# Patient Record
Sex: Female | Born: 1995 | Hispanic: Yes | Smoking: Never smoker
Health system: Southern US, Community
[De-identification: ages and names within clinical notes are randomized; demographics above are authoritative.]

## PROBLEM LIST (undated history)

## (undated) DIAGNOSIS — J45909 Unspecified asthma, uncomplicated: Secondary | ICD-10-CM

## (undated) HISTORY — PX: NO PAST SURGERIES: SHX2092

## (undated) HISTORY — DX: Unspecified asthma, uncomplicated: J45.909

---
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
## 2013-06-17 ENCOUNTER — Ambulatory Visit: Payer: Self-pay | Admitting: Primary Care

## 2013-10-31 ENCOUNTER — Observation Stay: Payer: Self-pay

## 2013-11-01 ENCOUNTER — Inpatient Hospital Stay: Payer: Self-pay | Admitting: Obstetrics and Gynecology

## 2013-11-02 LAB — CBC WITH DIFFERENTIAL/PLATELET
BASOS ABS: 0.1 10*3/uL (ref 0.0–0.1)
Basophil %: 0.7 %
EOS PCT: 1.2 %
Eosinophil #: 0.1 10*3/uL (ref 0.0–0.7)
HCT: 33.8 % — AB (ref 35.0–47.0)
HGB: 11.1 g/dL — AB (ref 12.0–16.0)
Lymphocyte #: 2.1 10*3/uL (ref 1.0–3.6)
Lymphocyte %: 27.7 %
MCH: 27.5 pg (ref 26.0–34.0)
MCHC: 32.7 g/dL (ref 32.0–36.0)
MCV: 84 fL (ref 80–100)
Monocyte #: 0.7 x10 3/mm (ref 0.2–0.9)
Monocyte %: 8.7 %
NEUTROS PCT: 61.7 %
Neutrophil #: 4.7 10*3/uL (ref 1.4–6.5)
PLATELETS: 158 10*3/uL (ref 150–440)
RBC: 4.03 10*6/uL (ref 3.80–5.20)
RDW: 14.9 % — ABNORMAL HIGH (ref 11.5–14.5)
WBC: 7.7 10*3/uL (ref 3.6–11.0)

## 2013-11-03 LAB — HEMATOCRIT: HCT: 33.1 % — ABNORMAL LOW (ref 35.0–47.0)

## 2015-03-06 NOTE — H&P (Signed)
L&D Evaluation:  History:  HPI 19 yo G3P2002 with LMP of 01/17/13 & EDd of 10/24/13 & US dating at 19.5 weeks [facility] for post-dates IOL. EDD according to US is inaccurate due to a reading of 2011. Will call the clinic to see if pt had an US this pregnancy. Cervidil last pm and now is in active labor process. NO ROM, VB,BP stable, FHR reassuring.   Presents with IOL for post-dates   Patient's Medical History Asthma  Allergic rhinitis, anemia,   Patient's Surgical History none   Medications Pre Natal Vitamins   Allergies NKDA   Social History none   Family History Non-Contributory   ROS:  ROS All systems were reviewed.  HEENT, CNS, GI, GU, Respiratory, CV, Renal and Musculoskeletal systems were found to be normal.   Exam:  Vital Signs stable  107/61   General no apparent distress   Mental Status clear   Chest clear   Heart normal sinus rhythm, no murmur/gallop/rubs   Abdomen gravid, tender with contractions   Estimated Fetal Weight Average for gestational age   Fetal Position vtx   Back no CVAT   Reflexes 2+   Clonus positive, 1-2 beats Lt foot   Mebranes Ruptured, 0915 bloody clear   Description bloody   FHT normal rate with no decels, q 3 mins, mod   FHT Description reassuring, no decels, reactive   Ucx regular   Skin dry   Lymph no lymphadenopathy   Impression:  Impression active labor, IUP at 41 2/7/weeks   Plan:  Plan monitor contractions and for cervical change, AROM for bld tinged clear fluid   Comments Trying to get anatomy scan US since it is not on chart. AROM performed for blood tinged clear fluid. Cx 4/80/vtx-2   Electronic Signatures: Sharee PimpleJones, Kathlen Sakurai W (CNM)  (Signed 07-Jan-15 09:27)  Authored: L&D Evaluation   Last Updated: 07-Jan-15 09:27 by Sharee PimpleJones, Atif Chapple W (CNM)

## 2016-11-27 ENCOUNTER — Inpatient Hospital Stay (HOSPITAL_COMMUNITY): Admission: AD | Admit: 2016-11-27 | Payer: Self-pay | Source: Ambulatory Visit | Admitting: Family Medicine

## 2018-10-27 NOTE — L&D Delivery Note (Addendum)
Obstetrical Delivery Note   Date of Delivery:   09/29/2019 Primary OB:   [facility] Gestational Age/EDD: [redacted]w[redacted]d (Dated by 9 wk [place]) Antepartum complications: LLQ fetal cyst  Delivered By:   Dalia Heading, CNM  Delivery Type:   spontaneous vaginal delivery  Procedure Details:   Pushed with mother to reduce anterior lip. Mother than pushed just short of 2 hours in various positions to try to turn OP presentation. Baby delivered direct OP with nuchal cord x 1 reduced over shoulder. Terminal meconium present. Spontaneous delivery of intact placenta and 3 vessel cord. IV Pitocin and fundal massage resulted in firm fundus and minimal bleeding. EBL 100 ml Anesthesia:    epidural Intrapartum complications: Persistent occiput posterior GBS:    negative Laceration:    Small first degree perineal Episiotomy:    none Placenta:    Via active 3rd stage. To pathology: no Estimated Blood Loss:  100 ml Baby:    Liveborn female, Apgars 9/9, weight pending    Dalia Heading, CNM  Attestation of Attending Supervision of Advanced Practitioner (CNM):  Evaluation and management procedures were performed by the Advanced Practitioner under my supervision and collaboration.  Although I was not present for the delivery itself,  I have reviewed the Advanced Practitioner's note and chart, and I agree with the management and plan.  Barnett Applebaum, MD, [facility], [facility] 09/29/2019  7:15 AM

## 2019-03-01 ENCOUNTER — Other Ambulatory Visit (HOSPITAL_COMMUNITY)
Admission: RE | Admit: 2019-03-01 | Discharge: 2019-03-01 | Disposition: A | Discharge status: Home / Self Care | Payer: Medicaid Other | Source: Ambulatory Visit | Attending: Obstetrics and Gynecology | Admitting: Obstetrics and Gynecology

## 2019-03-01 ENCOUNTER — Encounter: Payer: Self-pay | Admitting: Obstetrics and Gynecology

## 2019-03-01 ENCOUNTER — Ambulatory Visit (INDEPENDENT_AMBULATORY_CARE_PROVIDER_SITE_OTHER): Payer: Medicaid Other | Admitting: Obstetrics and Gynecology

## 2019-03-01 ENCOUNTER — Other Ambulatory Visit: Payer: Self-pay

## 2019-03-01 ENCOUNTER — Ambulatory Visit (INDEPENDENT_AMBULATORY_CARE_PROVIDER_SITE_OTHER): Payer: Medicaid Other

## 2019-03-01 VITALS — BP 118/70 | Ht 66.0 in | Wt 186.0 lb

## 2019-03-01 DIAGNOSIS — Z113 Encounter for screening for infections with a predominantly sexual mode of transmission: Secondary | ICD-10-CM | POA: Insufficient documentation

## 2019-03-01 DIAGNOSIS — Z3A10 10 weeks gestation of pregnancy: Secondary | ICD-10-CM | POA: Diagnosis not present

## 2019-03-01 DIAGNOSIS — Z3481 Encounter for supervision of other normal pregnancy, first trimester: Secondary | ICD-10-CM | POA: Diagnosis not present

## 2019-03-01 DIAGNOSIS — Z3687 Encounter for antenatal screening for uncertain dates: Secondary | ICD-10-CM | POA: Diagnosis not present

## 2019-03-01 DIAGNOSIS — Z3480 Encounter for supervision of other normal pregnancy, unspecified trimester: Secondary | ICD-10-CM

## 2019-03-01 DIAGNOSIS — Z124 Encounter for screening for malignant neoplasm of cervix: Secondary | ICD-10-CM

## 2019-03-01 DIAGNOSIS — Z3400 Encounter for supervision of normal first pregnancy, unspecified trimester: Secondary | ICD-10-CM

## 2019-03-01 NOTE — Progress Notes (Signed)
03/01/2019   Chief Complaint: Missed period  Transfer of Care Patient: no  History of Present Illness: [name] is a 23 y.o. [id number] 6424w1d based on Patient's last menstrual period was 12/20/2018 (within days). with an Estimated Date of Delivery: 09/26/19, with the above CC.   Her periods were: regular periods every 30 days She was using no method when she conceived.  She has Positive signs or symptoms of nausea/vomiting of pregnancy. She has Negative signs or symptoms of miscarriage or preterm labor She was not taking different medications around the time she conceived/early pregnancy. Since her LMP, she has not used alcohol Since her LMP, she has not used tobacco products Since her LMP, she has not used illegal drugs.   She claims she has gained   0 pounds since the start of her pregnancy.   Infection History:  1. Since her LMP, she has not had a viral illness.  2. She admits to close contact with children on a regular basis  yes  3. She has a history of chicken pox, or vaccination for chicken pox in the past. 4. Patient or partner has history of genital herpes  no 5. History of STI (GC, CT, HPV, syphilis, HIV)  no   6.  She does not live with someone with TB or TB exposed. 7. History of recent travel :  no 8. She identifies Negative Zika risk factors for her and her partner 329. There are not cats in the home in the home  She understands that while pregnant she should not change cat litter.   Genetic Screening Questions: (Includes patient, baby's father, or anyone in either family)   1. [facility]/= [facility]  no 2. Thalassemia ([place], [place], Mediterranean, or Asian background): MCV<80  no 3. Neural tube defect (meningomyelocele, spina bifida, anencephaly)  no 4. Congenital heart defect  no  5. Down syndrome  no 6. Tay-Sachs (Jewish, [place])  no 7. Canavan's Disease  no 8. Sickle cell disease or trait (African)  no  9. Hemophilia or other blood disorders   no  10. Muscular dystrophy  no  11. Cystic fibrosis  no  12. Huntington's Chorea  no  13. Mental retardation/autism  no 14. Other inherited genetic or chromosomal disorder  no 15. Maternal metabolic disorder (DM, PKU, etc)  no 16. Patient or FOB with a child with a birth defect not listed above no  16a. Patient or FOB with a birth defect themselves no 17. Recurrent pregnancy loss, or stillbirth  no  18. Any medications since LMP other than prenatal vitamins (include vitamins, supplements, OTC meds, drugs, alcohol)  no 19. Any other genetic/environmental exposure to discuss  no  ROS:  ROS  OBGYN History: As per HPI. OB History  Gravida Para Term Preterm AB Living  4 3 3     3   SAB TAB Ectopic Multiple Live Births          3    # Outcome Date GA Lbr Len/2nd Weight Sex Delivery Anes PTL Lv  4 Current           3 Term 11/02/13 6611w0d  8 lb 2 oz (3.685 kg) F Vag-Spont   LIV  2 Term 01/16/11 3411w0d  9 lb 2 oz (4.139 kg) M Vag-Spont   LIV  1 Term 12/03/09 4114w0d  7 lb 2 oz (3.232 kg) F Vag-Spont   LIV    Any issues with any prior pregnancies: no Any prior children  are healthy, doing well, without any problems or issues: yes History of pap smears: No.  History of STIs: No   Past Medical History: Past Medical History:  Diagnosis Date  . Asthma     Past Surgical History: History reviewed. No pertinent surgical history.  Family History:  History reviewed. No pertinent family history. She denies any female cancers, bleeding or blood clotting disorders.  She denies any history of mental retardation, birth defects or genetic disorders in her or the FOB's history  Social History:  Social History   Socioeconomic History  . Marital status: Single    Spouse name: Not on file  . Number of children: Not on file  . Years of education: Not on file  . Highest education level: Not on file  Occupational History  . Not on file  Social Needs  . Financial resource strain: Not on file   . Food insecurity:    Worry: Not on file    Inability: Not on file  . Transportation needs:    Medical: Not on file    Non-medical: Not on file  Tobacco Use  . Smoking status: Never Smoker  . Smokeless tobacco: Never Used  Substance and Sexual Activity  . Alcohol use: Never    Frequency: Never  . Drug use: Never  . Sexual activity: Yes    Birth control/protection: None  Lifestyle  . Physical activity:    Days per week: Not on file    Minutes per session: Not on file  . Stress: Not on file  Relationships  . Social connections:    Talks on phone: Not on file    Gets together: Not on file    Attends religious service: Not on file    Active member of club or organization: Not on file    Attends meetings of clubs or organizations: Not on file    Relationship status: Not on file  . Intimate partner violence:    Fear of current or ex partner: Not on file    Emotionally abused: Not on file    Physically abused: Not on file    Forced sexual activity: Not on file  Other Topics Concern  . Not on file  Social History Narrative  . Not on file    Allergy: No Known Allergies  Current Outpatient Medications:  Current Outpatient Medications:  .  Prenatal Vit-Fe Fumarate-FA (MULTIVITAMIN-PRENATAL) 27-0.8 MG TABS tablet, Take 1 tablet by mouth daily at 12 noon., Disp: , Rfl:    Physical Exam: Physical Exam could not be performed. Because of the COVID-19 outbreak this visit was performed over the phone and not in person.    Assessment: [name] is a 23 y.o. [phone] [redacted]w[redacted]d based on Patient's last menstrual period was 12/20/2018 (within days). with an Estimated Date of Delivery: 09/26/19,  for prenatal care.  Plan:  1) Avoid alcoholic beverages. 2) Patient encouraged not to smoke.  3) Discontinue the use of all non-medicinal drugs and chemicals.  4) Take prenatal vitamins daily.  5) Seatbelt use advised 6) Nutrition, food safety (fish, cheese advisories, and high nitrite  foods) and exercise discussed. 7) Hospital and practice style delivering at [facility] discussed  8) Patient is asked about travel to areas at risk for the Zika virus, and counseled to avoid travel and exposure to mosquitoes or sexual partners who may have themselves been exposed to the virus. Testing is discussed, and will be ordered as appropriate.  9) Childbirth classes at [facility] advised 10)  Genetic Screening, such as with 1st Trimester Screening, cell free fetal DNA, AFP testing, and Ultrasound, as well as with amniocentesis and CVS as appropriate, is discussed with patient. She plans to have genetic testing this pregnancy.   NOB labs today Dating [place] today- Due date updated. Maternit21 desired- will need to obtain at next visit.  Problem list reviewed and updated.  I discussed the assessment and treatment plan with the patient. The patient was provided an opportunity to ask questions and all were answered. The patient agreed with the plan and demonstrated an understanding of the instructions.   Adelene Idler MD [facility], [facility] 03/01/2019 5:51 PM

## 2019-03-01 NOTE — Patient Instructions (Signed)
 Hello,  Given the current COVID-19 pandemic, our practice is making changes in how we are providing care to our patients. We are limiting in-person visits for the safety of all of our patients.   As a practice, we have met to discuss the best way to minimize visits, but still provide excellent care to our expecting mothers.  We have decided on the following visit structure for low-risk pregnancies.  Initial Pregnancy visit will be conducted as a telephone or web visit.  Between 10-14 weeks  there will be one in-person visit for an ultrasound, lab work, and genetic screening. 20 weeks in-person visit with an anatomy ultrasound  28 weeks in-person office visit for a 1-hour glucose test and a TDAP vaccination 32 weeks in-person office visit 34 weeks telephone visit 36 weeks in-person office visit for GBS, chlamydia, and gonorrhea testing 38 weeks in-person office visit 40 weeks in-person office visit  Understandably, some patients will require more visits than what is outlined above. Additional visits will be determined on a case-by-case basis.   We will, as always, be available for emergencies or to address concerns that might arise between in-person visits. We ask that you allow us the opportunity to address any concerns over the phone or through a virtual visit first. We will be available to return your phone calls throughout the day.   If you are able to purchase a scale, a blood pressure machine, and a home fetal doppler visits could be limited further. This will help decrease your exposure risks, but these purchases are not a necessity.   Things seem to change daily and there is the possibility that this structure could change, please be patient as we adapt to a new way of caring for patients.   Thank you for trusting us with your prenatal care. Our practice values you and looks forward to providing you with excellent care.   Sincerely,   [facility], [facility]      COVID-19 and Your Pregnancy FAQ  How can I prevent infection with COVID-19 during my pregnancy? Social distancing is key. Please limit any interactions in public. Try and work from home if possible. Frequently wash your hands after touching possibly contaminated surfaces. Avoid touching your face.  Minimize trips to the store. Consider online ordering when possible.   Should I wear a mask? YES. It is recommended by the CDC that all people wear a cloth mask or facial covering in public. You should wear a mask to your visits in the office. This will help reduce transmission as well as your risk or acquiring COVID-19. New studies are showing that even asymptomatic individuals can spread the virus from talking.   Where can I get a mask? [place] and the city of [place] are partnering to provide masks to community members. You can pick up a mask from several locations. This website also has instructions about how to make a mask by sewing or without sewing by using a t-shirt or bandana.  https://www.[place].gov/i-want-to/learn-about/covid-19-information-and-updates/covid-19-face-mask-project  Studies have shown that if you were a tube or nylon stocking from pantyhose over a cloth mask it makes the cloth mask almost as effective as a N95 mask.  https://www.npr.org/sections/goatsandsoda/2019/02/16/840146830/adding-a-nylon-stocking-layer-could-boost-protection-from-cloth-masks-study-find  What are the symptoms of COVID-19? Fever (greater than 100.4 F), dry cough, shortness of breath.  Am I more at risk for COVID-19 since I am pregnant? There is not currently data showing that pregnant women are more adversely impacted by COVID-19 than the general population. However,   we know that pregnant women tend to have worse respiratory complications from similar diseases such as the flu and SARS and for this reason should be considered an at-risk population.  What do I do if I am  experiencing the symptoms of COVID-19? Testing is being limited because of test availability. If you are experiencing symptoms you should quarantine yourself, and the members of your family, for at least 2 weeks at home.   Please visit this website for more information: https://www.cdc.gov/coronavirus/2019-ncov/if-you-are-sick/steps-when-sick.html  When should I go to the Emergency Room? Please go to the emergency room if you are experiencing ANY of these symptoms*:  1.    Difficulty breathing or shortness of breath 2.    Persistent pain or pressure in the chest 3.    Confusion or difficulty being aroused (or awakened) 4.    Bluish lips or face  *This list is not all inclusive. Please consult our office for any other symptoms that are severe or concerning.  What do I do if I am having difficulty breathing? You should go to the Emergency Room for evaluation. At this time they have a tent set up for evaluating patients with COVID-19 symptoms.   How will my prenatal care be different because of the COVID-19 pandemic? It has been recommended to reduce the frequency of face-to-face visits and use resources such as telephone and virtual visits when possible. Using a scale, blood pressure machine and fetal doppler at home can further help reduce face-to-face visits. You will be provided with additional information on this topic.  We ask that you come to your visits alone to minimize potential exposures to  COVID-19.  How can I receive childbirth education? At this time in-person classes have been cancelled. You can register for online childbirth education, breastfeeding, and newborn care classes.  Please visit:  [email or web address] for more information  How will my hospital birth experience be different? The hospital is currently limiting visitors. This means that while you are in labor you can only have one person at the hospital with you. Additional family members will not be allowed  to wait in the building or outside your room. Your one support person can be the father of the baby, a relative, a doula, or a friend. Once one support person is designated that person will wear a band. This band cannot be shared with multiple people.  Nitrous Gas is not being offered for pain relief since the tubing and filter for the machine can not be sanitized in a way to guarantee prevention of transmission of COVID-19.  Nasal cannula use of oxygen for fetal indications has also been discontinued.  Currently a clear plastic sheet is being hung between mom and the delivering provider during pushing and delivery to help prevent transmission of COVID-19.      How long will I stay in the hospital for after giving birth? It is also recommended that discharge home be expedited during the COVID-19 outbreak. This means staying for 1 day after a vaginal delivery and 2 days after a cesarean section. Patients who need to stay longer for medical reasons are allowed to do so, but the goal will be for expedited discharge home.   What if I have COVID-19 and I am in labor? We ask that you wear a mask while on labor and delivery. We will try and accommodate you being placed in a room that is capable of filtering the air. Please call ahead if you are in labor and on your   way to the hospital. The phone number for labor and delivery at  [facility] is [phone].  If I have COVID-19 when my baby is born how can I prevent my baby from contracting COVID-19? This is an issue that will have to be discussed on a case-by-case basis. Current recommendations suggest providing separate isolation rooms for both the mother and new infant as well as limiting visitors. However, there are practical challenges to this recommendation. The situation will assuredly change and decisions will be influenced by the desires of the mother and availability of space.  Some suggestions are the use of a curtain or  physical barrier between mom and infant, hand hygiene, mom wearing a mask, or 6 feet of spacing between a mom and infant.   Can I breastfeed during the COVID-19 pandemic?   Yes, breastfeeding is encouraged.  Can I breastfeed if I have COVID-19? Yes. Covid-19 has not been found in breast milk. This means you cannot give COVID-19 to your child through breast milk. Breast feeding will also help pass antibodies to fight infection to your baby.   What precautions should I take when breastfeeding if I have COVID-19? If a mother and newborn do room-in and the mother wishes to feed at the breast, she should put on a facemask and practice hand hygiene before each feeding.  What precautions should I take when pumping if I have COVID-19? Prior to expressing breast milk, mothers should practice hand hygiene. After each pumping session, all parts that come into contact with breast milk should be thoroughly washed and the entire pump should be appropriately disinfected per the manufacturer's instructions. This expressed breast milk should be fed to the newborn by a healthy caregiver.  What if I am pregnant and work in healthcare? Based on limited data regarding COVID-19 and pregnancy, ACOG currently does not propose creating additional restrictions on pregnant health care personnel because of COVID-19 alone. Pregnant women do not appear to be at higher risk of severe disease related to COVID-19. Pregnant health care personnel should follow CDC risk assessment and infection control guidelines for health care personnel exposed to patients with suspected or confirmed COVID-19. Adherence to recommended infection prevention and control practices is an important part of protecting all health care personnel in health care settings.    Information on COVID-19 in pregnancy is very limited; however, facilities may want to consider limiting exposure of pregnant health care personnel to patients with confirmed or suspected  COVID-19 infection, especially during higher-risk procedures (eg, aerosol-generating procedures), if feasible, based on staffing availability.    

## 2019-03-01 NOTE — Progress Notes (Signed)
NOB C/o nausea and vomiting, some stomach pain off and on Did not get flu shot this season  Only home pregnancy test

## 2019-03-02 LAB — MONITOR DRUG PROFILE 10(MW)
Amphetamine Scrn, Ur: NEGATIVE ng/mL
BARBITURATE SCREEN URINE: NEGATIVE ng/mL
BENZODIAZEPINE SCREEN, URINE: NEGATIVE ng/mL
CANNABINOIDS UR QL SCN: NEGATIVE ng/mL
Cocaine (Metab) Scrn, Ur: NEGATIVE ng/mL
Creatinine(Crt), U: 223.9 mg/dL (ref 20.0–300.0)
Methadone Screen, Urine: NEGATIVE ng/mL
OXYCODONE+OXYMORPHONE UR QL SCN: NEGATIVE ng/mL
Opiate Scrn, Ur: NEGATIVE ng/mL
Ph of Urine: 7 (ref 4.5–8.9)
Phencyclidine Qn, Ur: NEGATIVE ng/mL
Propoxyphene Scrn, Ur: NEGATIVE ng/mL

## 2019-03-02 LAB — RPR+RH+ABO+RUB AB+AB SCR+CB...
Antibody Screen: NEGATIVE
HIV Screen 4th Generation wRfx: NONREACTIVE
Hematocrit: 43.2 % (ref 34.0–46.6)
Hemoglobin: 14.4 g/dL (ref 11.1–15.9)
Hepatitis B Surface Ag: NEGATIVE
MCH: 27.6 pg (ref 26.6–33.0)
MCHC: 33.3 g/dL (ref 31.5–35.7)
MCV: 83 fL (ref 79–97)
Platelets: 209 10*3/uL (ref 150–450)
RBC: 5.22 x10E6/uL (ref 3.77–5.28)
RDW: 14.2 % (ref 11.7–15.4)
RPR Ser Ql: NONREACTIVE
Rh Factor: POSITIVE
Rubella Antibodies, IGG: 2.32 index (ref >0.99)
Varicella zoster IgG: 917 index (ref >165)
WBC: 9.9 10*3/uL (ref 3.4–10.8)

## 2019-03-03 LAB — CERVICOVAGINAL ANCILLARY ONLY
Chlamydia: NEGATIVE
Neisseria Gonorrhea: NEGATIVE

## 2019-03-03 LAB — URINE CULTURE: Organism ID, Bacteria: NO GROWTH

## 2019-03-04 LAB — CYTOLOGY - PAP: Diagnosis: NEGATIVE

## 2019-03-04 NOTE — Progress Notes (Signed)
Please call and let patient know results are normal. Thank you

## 2019-03-07 NOTE — Progress Notes (Signed)
Pt aware.

## 2019-03-22 ENCOUNTER — Other Ambulatory Visit: Payer: Self-pay

## 2019-03-22 ENCOUNTER — Encounter: Payer: Self-pay | Admitting: Obstetrics and Gynecology

## 2019-03-22 ENCOUNTER — Ambulatory Visit (INDEPENDENT_AMBULATORY_CARE_PROVIDER_SITE_OTHER): Payer: Medicaid Other | Admitting: Obstetrics and Gynecology

## 2019-03-22 VITALS — BP 122/80 | Wt 188.0 lb

## 2019-03-22 DIAGNOSIS — Z3480 Encounter for supervision of other normal pregnancy, unspecified trimester: Secondary | ICD-10-CM

## 2019-03-22 DIAGNOSIS — O99511 Diseases of the respiratory system complicating pregnancy, first trimester: Secondary | ICD-10-CM

## 2019-03-22 DIAGNOSIS — Z1379 Encounter for other screening for genetic and chromosomal anomalies: Secondary | ICD-10-CM

## 2019-03-22 DIAGNOSIS — Z3A12 12 weeks gestation of pregnancy: Secondary | ICD-10-CM

## 2019-03-22 DIAGNOSIS — J45909 Unspecified asthma, uncomplicated: Secondary | ICD-10-CM

## 2019-03-22 DIAGNOSIS — O99519 Diseases of the respiratory system complicating pregnancy, unspecified trimester: Secondary | ICD-10-CM | POA: Insufficient documentation

## 2019-03-22 NOTE — Progress Notes (Signed)
Routine Prenatal Care Visit  Subjective  Carmen Morales is a 23 y.o. [phone] at [redacted]w[redacted]d being seen today for ongoing prenatal care.  Carmen Morales is currently monitored for the following issues for this low-risk pregnancy and has Supervision of other normal pregnancy, antepartum and Asthma during pregnancy on their problem list.  ----------------------------------------------------------------------------------- Patient reports no complaints.    . Vag. Bleeding: None.   . Denies leaking of fluid.  ----------------------------------------------------------------------------------- The following portions of the patient's history were reviewed and updated as appropriate: allergies, current medications, past family history, past medical history, past social history, past surgical history and problem list. Problem list updated.   Objective  Blood pressure 122/80, weight 188 lb (85.3 kg), last menstrual period 12/20/2018. Pregravid weight 186 lb (84.4 kg) Total Weight Gain 2 lb (0.907 kg) Urinalysis: Urine Protein    Urine Glucose    Fetal Status: Fetal Heart Rate (bpm): 150         General:  Alert, oriented and cooperative. Patient is in no acute distress.  Skin: Skin is warm and dry. No rash noted.   Cardiovascular: Normal heart rate noted  Respiratory: Normal respiratory effort, no problems with respiration noted  Abdomen: Soft, gravid, appropriate for gestational age. Pain/Pressure: Absent     Pelvic:  Cervical exam deferred        Extremities: Normal range of motion.     Mental Status: Normal mood and affect. Normal behavior. Normal judgment and thought content.   Assessment   23 y.o. H4F2761 at [redacted]w[redacted]d by  10/04/2019, by Ultrasound presenting for routine prenatal visit  Plan   Pregnancy #4 Problems (from 12/20/18 to present)    Problem Noted Resolved   Supervision of other normal pregnancy, antepartum 03/22/2019 by Conard Novak, MD No   Overview Addendum 03/22/2019 11:26 AM by Conard Novak, MD    Clinic [facility] Prenatal Labs  Dating 9 wk u/s Blood type: O/Positive/-- (05/05 1518)   Genetic Screen 1 Screen:    AFP:     Quad:     NIPS: Antibody:Negative (05/05 1518)  Anatomic [place]  Rubella: 2.32 (05/05 1518)  Varicella: immune  GTT Early:               Third trimester:  RPR: Non Reactive (05/05 1518)   Rhogam  HBsAg: Negative (05/05 1518)   TDaP vaccine                       Flu Shot: HIV: Non Reactive (05/05 1518)   Baby Food                                GBS:   Contraception  Pap:  CBB     CS/VBAC    Support Person            Asthma during pregnancy 03/22/2019 by Conard Novak, MD No   Overview Signed 03/22/2019 11:26 AM by Conard Novak, MD    Rarely uses medicine. Continue to monitor.          Preterm labor symptoms and general obstetric precautions including but not limited to vaginal bleeding, contractions, leaking of fluid and fetal movement were reviewed in detail with the patient. Please refer to After Visit Summary for other counseling recommendations.   Return in about 4 weeks (around 04/19/2019) for Routine Prenatal Appointment/telephone.  Thomasene Mohair, MD, [facility], [facility] 03/22/2019  11:26 AM

## 2019-03-27 LAB — MATERNIT 21 PLUS CORE, BLOOD
Fetal Fraction: 12
Result (T21): NEGATIVE
Trisomy 13 (Patau syndrome): NEGATIVE
Trisomy 18 (Edwards syndrome): NEGATIVE
Trisomy 21 (Down syndrome): NEGATIVE

## 2019-03-28 ENCOUNTER — Telehealth: Payer: Self-pay

## 2019-03-28 NOTE — Telephone Encounter (Signed)
Pt calling for results except gender.  Would like for gender to be put in envelop.  [phone]  Courtesy call to pt to let her know SDJ is not in office today and will be here tomorrow.  Adv envelop will be up front for her to p/u.

## 2019-03-29 NOTE — Telephone Encounter (Signed)
Would you mind calling patient and letting her know her results were normal from a genetic standpoint? Also, would you mind printing her report and highlighting the gender and placing it in an envelope for her?  Thank you!

## 2019-03-29 NOTE — Telephone Encounter (Signed)
Pt aware via vm. Envelope up front for her

## 2019-04-19 ENCOUNTER — Other Ambulatory Visit: Payer: Self-pay

## 2019-04-19 ENCOUNTER — Ambulatory Visit (INDEPENDENT_AMBULATORY_CARE_PROVIDER_SITE_OTHER): Payer: Medicaid Other | Admitting: Certified Nurse Midwife

## 2019-04-19 DIAGNOSIS — Z3A16 16 weeks gestation of pregnancy: Secondary | ICD-10-CM

## 2019-04-19 DIAGNOSIS — Z3482 Encounter for supervision of other normal pregnancy, second trimester: Secondary | ICD-10-CM | POA: Diagnosis not present

## 2019-04-19 DIAGNOSIS — Z363 Encounter for antenatal screening for malformations: Secondary | ICD-10-CM

## 2019-04-19 NOTE — Progress Notes (Signed)
Routine Prenatal Care Visit- Virtual Visit  Subjective   Virtual Visit via Telephone Note  I connected with@ on 04/19/19 at 11:10 AM EDT by telephone and verified that I am speaking with the correct person using two identifiers.   I discussed the limitations, risks, security and privacy concerns of performing an evaluation and management service by telephone and the availability of in person appointments. I also discussed with the patient that there may be a patient responsible charge related to this service. The patient expressed understanding and agreed to proceed.  The patient was at home I spoke with the patient from my  Work station [employer] names of people involved in this encounter were: Loran Sentersita Johnson , and patient , and myself .   Carmen Morales is a 23 y.o. [phone] at 79109w0d being seen today for ongoing prenatal care.  She is currently monitored for the following issues for this low-risk pregnancy and has Supervision of other normal pregnancy, antepartum and Asthma during pregnancy on their problem list.  ----------------------------------------------------------------------------------- Patient reports no complaints.   Feeling some flutters  ----------------------------------------------------------------------------------- The following portions of the patient's history were reviewed and updated as appropriate: allergies, current medications, past family history, past medical history, past social history, past surgical history and problem list. Problem list updated.   Objective  Last menstrual period 12/20/2018. Pregravid weight 186 lb (84.4 kg) Total Weight Gain 2 lb (0.907 kg)  Fetal Status: feeling flutters Discussed negative MaterniT 21, does not want to know gender yet.  Physical Exam could not be performed. Because of the COVID-19 outbreak this visit was performed over the phone and not in person.   Assessment   23 y.o. [id number] at 60109w0d by  10/04/2019, by Ultrasound  presenting for routine prenatal visit  Plan   Pregnancy #4 Problems (from 12/20/18 to present)    Problem Noted Resolved   Supervision of other normal pregnancy, antepartum 03/22/2019 by Conard NovakJackson, Stephen D, MD No   Overview Addendum 03/22/2019 11:26 AM by Conard NovakJackson, Stephen D, MD    [facility]inic [facility] Prenatal Labs  Dating 9 wk u/s Blood type: O/Positive/-- (05/05 1518)   Genetic Screen 1 Screen:    AFP:     Quad:     NIPS: diploid XX Antibody:Negative (05/05 1518)  Anatomic US  Rubella: 2.32 (05/05 1518)  Varicella: immune  GTT Early:               Third trimester:  RPR: Non Reactive (05/05 1518)   Rhogam  HBsAg: Negative (05/05 1518)   TDaP vaccine                       Flu Shot: HIV: Non Reactive (05/05 1518)   Baby Food                                GBS:   Contraception  Pap:  CBB     CS/VBAC    Support Person            Asthma during pregnancy 03/22/2019 by Conard NovakJackson, Stephen D, MD No   Overview Signed 03/22/2019 11:26 AM by Conard NovakJackson, Stephen D, MD    Rarely uses medicine. Continue to monitor.          Gestational age appropriate obstetric precautions including but not limited to vaginal bleeding, contractions, leaking of fluid and fetal movement were reviewed in detail with the patient.  Follow Up Instructions: Offered MSAFP testing-declines at this time Anatomy scan and ROB in 4 weeks   I discussed the assessment and treatment plan with the patient. The patient was provided an opportunity to ask questions and all were answered. The patient agreed with the plan and demonstrated an understanding of the instructions.    I provided 7 minutes of non-face-to-face time during this encounter.   Dalia Heading, [facility], [facility] 04/19/2019 11:35 AM

## 2019-04-19 NOTE — Progress Notes (Signed)
No recent wt taken or BP at home; no problems.rj

## 2019-05-17 ENCOUNTER — Other Ambulatory Visit: Payer: Self-pay

## 2019-05-17 ENCOUNTER — Ambulatory Visit (INDEPENDENT_AMBULATORY_CARE_PROVIDER_SITE_OTHER): Payer: Medicaid Other

## 2019-05-17 ENCOUNTER — Ambulatory Visit (INDEPENDENT_AMBULATORY_CARE_PROVIDER_SITE_OTHER): Payer: Medicaid Other | Admitting: Advanced Practice Midwife

## 2019-05-17 ENCOUNTER — Encounter: Payer: Self-pay | Admitting: Advanced Practice Midwife

## 2019-05-17 VITALS — BP 124/78 | Wt 184.0 lb

## 2019-05-17 DIAGNOSIS — Z3A20 20 weeks gestation of pregnancy: Secondary | ICD-10-CM

## 2019-05-17 DIAGNOSIS — Z3482 Encounter for supervision of other normal pregnancy, second trimester: Secondary | ICD-10-CM

## 2019-05-17 DIAGNOSIS — Z363 Encounter for antenatal screening for malformations: Secondary | ICD-10-CM

## 2019-05-17 NOTE — Progress Notes (Signed)
Anatomy scan today. No vb. No lof.  Pt been really nauseas, but states its starting to get a little better at this point

## 2019-05-17 NOTE — Progress Notes (Signed)
Routine Prenatal Care Visit  Subjective  Carmen Morales Maryjean Morn is a 23 y.o. [phone] at [redacted]w[redacted]d being seen today for ongoing prenatal care.  She is currently monitored for the following issues for this low-risk pregnancy and has Supervision of other normal pregnancy, antepartum and Asthma during pregnancy on their problem list.  ----------------------------------------------------------------------------------- Patient reports no complaints. Her nausea is decreasing and she admits an appetite.   . Vag. Bleeding: None.  Movement: Present. Denies leaking of fluid.  ----------------------------------------------------------------------------------- The following portions of the patient's history were reviewed and updated as appropriate: allergies, current medications, past family history, past medical history, past social history, past surgical history and problem list. Problem list updated.   Objective  Blood pressure 124/78, weight 184 lb (83.5 kg), last menstrual period 12/20/2018. Pregravid weight 186 lb (84.4 kg) Total Weight Gain -2 lb (-0.907 kg) Urinalysis: Urine Protein    Urine Glucose    Fetal Status: Fetal Heart Rate (bpm): 137 Fundal Height: 20 cm Movement: Present     Anatomy scan: complete, normal, female, placenta anterior  General:  Alert, oriented and cooperative. Patient is in no acute distress.  Skin: Skin is warm and dry. No rash noted.   Cardiovascular: Normal heart rate noted  Respiratory: Normal respiratory effort, no problems with respiration noted  Abdomen: Soft, gravid, appropriate for gestational age. Pain/Pressure: Absent     Pelvic:  Cervical exam deferred        Extremities: Normal range of motion.     Mental Status: Normal mood and affect. Normal behavior. Normal judgment and thought content.   Assessment   23 y.o. [id number] at [redacted]w[redacted]d by  10/04/2019, by Ultrasound presenting for routine prenatal visit  Plan   Pregnancy #4 Problems (from 12/20/18 to present)    Problem Noted Resolved   Supervision of other normal pregnancy, antepartum 03/22/2019 by Will Bonnet, MD No   Overview Addendum 03/22/2019 11:26 AM by Will Bonnet, MD    [facility] Prenatal Labs  Dating 9 wk u/s Blood type: O/Positive/-- (05/05 1518)   Genetic Screen 1 Screen:    AFP:     Quad:     NIPS: Antibody:Negative (05/05 1518)  Anatomic [place]  Rubella: 2.32 (05/05 1518)  Varicella: immune  GTT Early:               Third trimester:  RPR: Non Reactive (05/05 1518)   Rhogam  HBsAg: Negative (05/05 1518)   TDaP vaccine                       Flu Shot: HIV: Non Reactive (05/05 1518)   Baby Food                                GBS:   Contraception  Pap:  CBB     CS/VBAC    Support Person            Asthma during pregnancy 03/22/2019 by Will Bonnet, MD No   Overview Signed 03/22/2019 11:26 AM by Will Bonnet, MD    Rarely uses medicine. Continue to monitor.          Preterm labor symptoms and general obstetric precautions including but not limited to vaginal bleeding, contractions, leaking of fluid and fetal movement were reviewed in detail with the patient. Please refer to After Visit Summary for other counseling recommendations.   Return in about 4 weeks (around  06/14/2019) for rob.  Tresea MallJane Carmela Piechowski, CNM 05/17/2019 10:58 AM

## 2019-06-14 ENCOUNTER — Other Ambulatory Visit: Payer: Self-pay

## 2019-06-14 ENCOUNTER — Ambulatory Visit (INDEPENDENT_AMBULATORY_CARE_PROVIDER_SITE_OTHER): Payer: Medicaid Other | Admitting: Maternal Newborn

## 2019-06-14 VITALS — BP 100/70 | Wt 187.0 lb

## 2019-06-14 DIAGNOSIS — Z3480 Encounter for supervision of other normal pregnancy, unspecified trimester: Secondary | ICD-10-CM

## 2019-06-14 DIAGNOSIS — Z3A24 24 weeks gestation of pregnancy: Secondary | ICD-10-CM

## 2019-06-14 DIAGNOSIS — Z3482 Encounter for supervision of other normal pregnancy, second trimester: Secondary | ICD-10-CM

## 2019-06-14 NOTE — Progress Notes (Signed)
    Routine Prenatal Care Visit  Subjective  Sharae Zappulla Maryjean Morn is a 23 y.o. [phone] at [redacted]w[redacted]d being seen today for ongoing prenatal care.  She is currently monitored for the following issues for this low-risk pregnancy and has Supervision of other normal pregnancy, antepartum and Asthma during pregnancy on their problem list.  ----------------------------------------------------------------------------------- Patient reports concern about low weight gain. Her appetite is improving and she is not having any nausea/vomiting. Vag. Bleeding: None.  Movement: Present. No leaking of fluid.  ----------------------------------------------------------------------------------- The following portions of the patient's history were reviewed and updated as appropriate: allergies, current medications, past family history, past medical history, past social history, past surgical history and problem list. Problem list updated.   Objective  Blood pressure 100/70, weight 187 lb (84.8 kg), last menstrual period 12/20/2018. Pregravid weight 186 lb (84.4 kg) Total Weight Gain 1 lb (0.454 kg)  Fetal Status: Fetal Heart Rate (bpm): 136 Fundal Height: 25 cm Movement: Present     General:  Alert, oriented and cooperative. Patient is in no acute distress.  Skin: Skin is warm and dry. No rash noted.   Cardiovascular: Normal heart rate noted  Respiratory: Normal respiratory effort, no problems with respiration noted  Abdomen: Soft, gravid, appropriate for gestational age. Pain/Pressure: Absent     Pelvic:  Cervical exam deferred        Extremities: Normal range of motion.  Edema: None  Mental Status: Normal mood and affect. Normal behavior. Normal judgment and thought content.     Assessment   23 y.o. [id number] at [redacted]w[redacted]d, EDD 10/04/2019 by Ultrasound presenting for a routine prenatal visit.  Plan   Pregnancy #4 Problems (from 12/20/18 to present)    Problem Noted Resolved   Supervision of other normal pregnancy,  antepartum 03/22/2019 by Will Bonnet, MD No   Overview Addendum 03/22/2019 11:26 AM by Will Bonnet, MD    [facility] Prenatal Labs  Dating 9 wk u/s Blood type: O/Positive/-- (05/05 1518)   Genetic Screen 1 Screen:    AFP:     Quad:     NIPS: Antibody:Negative (05/05 1518)  Anatomic [place]  Rubella: 2.32 (05/05 1518)  Varicella: immune  GTT Early:               Third trimester:  RPR: Non Reactive (05/05 1518)   Rhogam  HBsAg: Negative (05/05 1518)   TDaP vaccine                       Flu Shot: HIV: Non Reactive (05/05 1518)   Baby Food                                GBS:   Contraception  Pap:  CBB     CS/VBAC    Support Person            Asthma during pregnancy 03/22/2019 by Will Bonnet, MD No   Overview Signed 03/22/2019 11:26 AM by Will Bonnet, MD    Rarely uses medicine. Continue to monitor.       Discussed eating high calorie nutritional foods. Fundal height appropriate for gestational age.  GTT/labs next visit.  Please refer to After Visit Summary for other counseling recommendations.   Return in about 4 weeks (around 07/12/2019) for ROB and GTT/labs.  Avel Sensor, CNM 06/14/2019  11:02 AM

## 2019-06-14 NOTE — Patient Instructions (Signed)
Third Trimester of Pregnancy The third trimester is from week 28 through week 40 (months 7 through 9). The third trimester is a time when the unborn baby (fetus) is growing rapidly. At the end of the ninth month, the fetus is about 20 inches in length and weighs 6-10 pounds. Body changes during your third trimester Your body will continue to go through many changes during pregnancy. The changes vary from woman to woman. During the third trimester:  Your weight will continue to increase. You can expect to gain 25-35 pounds (11-16 kg) by the end of the pregnancy.  You may begin to get stretch marks on your hips, abdomen, and breasts.  You may urinate more often because the fetus is moving lower into your pelvis and pressing on your bladder.  You may develop or continue to have heartburn. This is caused by increased hormones that slow down muscles in the digestive tract.  You may develop or continue to have constipation because increased hormones slow digestion and cause the muscles that push waste through your intestines to relax.  You may develop hemorrhoids. These are swollen veins (varicose veins) in the rectum that can itch or be painful.  You may develop swollen, bulging veins (varicose veins) in your legs.  You may have increased body aches in the pelvis, back, or thighs. This is due to weight gain and increased hormones that are relaxing your joints.  You may have changes in your hair. These can include thickening of your hair, rapid growth, and changes in texture. Some women also have hair loss during or after pregnancy, or hair that feels dry or thin. Your hair will most likely return to normal after your baby is born.  Your breasts will continue to grow and they will continue to become tender. A yellow fluid (colostrum) may leak from your breasts. This is the first milk you are producing for your baby.  Your belly button may stick out.  You may notice more swelling in your hands,  face, or ankles.  You may have increased tingling or numbness in your hands, arms, and legs. The skin on your belly may also feel numb.  You may feel short of breath because of your expanding uterus.  You may have more problems sleeping. This can be caused by the size of your belly, increased need to urinate, and an increase in your body's metabolism.  You may notice the fetus "dropping," or moving lower in your abdomen (lightening).  You may have increased vaginal discharge.  You may notice your joints feel loose and you may have pain around your pelvic bone. What to expect at prenatal visits You will have prenatal exams every 2 weeks until week 36. Then you will have weekly prenatal exams. During a routine prenatal visit:  You will be weighed to make sure you and the baby are growing normally.  Your blood pressure will be taken.  Your abdomen will be measured to track your baby's growth.  The fetal heartbeat will be listened to.  Any test results from the previous visit will be discussed.  You may have a cervical check near your due date to see if your cervix has softened or thinned (effaced).  You will be tested for Group B streptococcus. This happens between 35 and 37 weeks. Your health care provider may ask you:  What your birth plan is.  How you are feeling.  If you are feeling the baby move.  If you have had any abnormal   symptoms, such as leaking fluid, bleeding, severe headaches, or abdominal cramping.  If you are using any tobacco products, including cigarettes, chewing tobacco, and electronic cigarettes.  If you have any questions. Other tests or screenings that may be performed during your third trimester include:  Blood tests that check for low iron levels (anemia).  Fetal testing to check the health, activity level, and growth of the fetus. Testing is done if you have certain medical conditions or if there are problems during the pregnancy.  Nonstress test  (NST). This test checks the health of your baby to make sure there are no signs of problems, such as the baby not getting enough oxygen. During this test, a belt is placed around your belly. The baby is made to move, and its heart rate is monitored during movement. What is false labor? False labor is a condition in which you feel small, irregular tightenings of the muscles in the womb (contractions) that usually go away with rest, changing position, or drinking water. These are called Braxton Hicks contractions. Contractions may last for hours, days, or even weeks before true labor sets in. If contractions come at regular intervals, become more frequent, increase in intensity, or become painful, you should see your health care provider. What are the signs of labor?  Abdominal cramps.  Regular contractions that start at 10 minutes apart and become stronger and more frequent with time.  Contractions that start on the top of the uterus and spread down to the lower abdomen and back.  Increased pelvic pressure and dull back pain.  A watery or bloody mucus discharge that comes from the vagina.  Leaking of amniotic fluid. This is also known as your "water breaking." It could be a slow trickle or a gush. Let your health care provider know if it has a color or strange odor. If you have any of these signs, call your health care provider right away, even if it is before your due date. Follow these instructions at home: Medicines  Follow your health care provider's instructions regarding medicine use. Specific medicines may be either safe or unsafe to take during pregnancy.  Take a prenatal vitamin that contains at least 600 micrograms (mcg) of folic acid.  If you develop constipation, try taking a stool softener if your health care provider approves. Eating and drinking   Eat a balanced diet that includes fresh fruits and vegetables, whole grains, good sources of protein such as meat, eggs, or tofu,  and low-fat dairy. Your health care provider will help you determine the amount of weight gain that is right for you.  Avoid raw meat and uncooked cheese. These carry germs that can cause birth defects in the baby.  If you have low calcium intake from food, talk to your health care provider about whether you should take a daily calcium supplement.  Eat four or five small meals rather than three large meals a day.  Limit foods that are high in fat and processed sugars, such as fried and sweet foods.  To prevent constipation: ? Drink enough fluid to keep your urine clear or pale yellow. ? Eat foods that are high in fiber, such as fresh fruits and vegetables, whole grains, and beans. Activity  Exercise only as directed by your health care provider. Most women can continue their usual exercise routine during pregnancy. Try to exercise for 30 minutes at least 5 days a week. Stop exercising if you experience uterine contractions.  Avoid heavy lifting.  Do   not exercise in extreme heat or humidity, or at high altitudes.  Wear low-heel, comfortable shoes.  Practice good posture.  You may continue to have sex unless your health care provider tells you otherwise. Relieving pain and discomfort  Take frequent breaks and rest with your legs elevated if you have leg cramps or low back pain.  Take warm sitz baths to soothe any pain or discomfort caused by hemorrhoids. Use hemorrhoid cream if your health care provider approves.  Wear a good support bra to prevent discomfort from breast tenderness.  If you develop varicose veins: ? Wear support pantyhose or compression stockings as told by your healthcare provider. ? Elevate your feet for 15 minutes, 3-4 times a day. Prenatal care  Write down your questions. Take them to your prenatal visits.  Keep all your prenatal visits as told by your health care provider. This is important. Safety  Wear your seat belt at all times when driving.  Make  a list of emergency phone numbers, including numbers for family, friends, the hospital, and police and fire departments. General instructions  Avoid cat litter boxes and soil used by cats. These carry germs that can cause birth defects in the baby. If you have a cat, ask someone to clean the litter box for you.  Do not travel far distances unless it is absolutely necessary and only with the approval of your health care provider.  Do not use hot tubs, steam rooms, or saunas.  Do not drink alcohol.  Do not use any products that contain nicotine or tobacco, such as cigarettes and e-cigarettes. If you need help quitting, ask your health care provider.  Do not use any medicinal herbs or unprescribed drugs. These chemicals affect the formation and growth of the baby.  Do not douche or use tampons or scented sanitary pads.  Do not cross your legs for long periods of time.  To prepare for the arrival of your baby: ? Take prenatal classes to understand, practice, and ask questions about labor and delivery. ? Make a trial run to the hospital. ? Visit the hospital and tour the maternity area. ? Arrange for maternity or paternity leave through employers. ? Arrange for family and friends to take care of pets while you are in the hospital. ? Purchase a rear-facing car seat and make sure you know how to install it in your car. ? Pack your hospital bag. ? Prepare the baby's nursery. Make sure to remove all pillows and stuffed animals from the baby's crib to prevent suffocation.  Visit your dentist if you have not gone during your pregnancy. Use a soft toothbrush to brush your teeth and be gentle when you floss. Contact a health care provider if:  You are unsure if you are in labor or if your water has broken.  You become dizzy.  You have mild pelvic cramps, pelvic pressure, or nagging pain in your abdominal area.  You have lower back pain.  You have persistent nausea, vomiting, or diarrhea.   You have an unusual or bad smelling vaginal discharge.  You have pain when you urinate. Get help right away if:  Your water breaks before 37 weeks.  You have regular contractions less than 5 minutes apart before 37 weeks.  You have a fever.  You are leaking fluid from your vagina.  You have spotting or bleeding from your vagina.  You have severe abdominal pain or cramping.  You have rapid weight loss or weight gain.  You have   shortness of breath with chest pain.  You notice sudden or extreme swelling of your face, hands, ankles, feet, or legs.  Your baby makes fewer than 10 movements in 2 hours.  You have severe headaches that do not go away when you take medicine.  You have vision changes. Summary  The third trimester is from week 28 through week 40, months 7 through 9. The third trimester is a time when the unborn baby (fetus) is growing rapidly.  During the third trimester, your discomfort may increase as you and your baby continue to gain weight. You may have abdominal, leg, and back pain, sleeping problems, and an increased need to urinate.  During the third trimester your breasts will keep growing and they will continue to become tender. A yellow fluid (colostrum) may leak from your breasts. This is the first milk you are producing for your baby.  False labor is a condition in which you feel small, irregular tightenings of the muscles in the womb (contractions) that eventually go away. These are called Braxton Hicks contractions. Contractions may last for hours, days, or even weeks before true labor sets in.  Signs of labor can include: abdominal cramps; regular contractions that start at 10 minutes apart and become stronger and more frequent with time; watery or bloody mucus discharge that comes from the vagina; increased pelvic pressure and dull back pain; and leaking of amniotic fluid. This information is not intended to replace advice given to you by your health  care provider. Make sure you discuss any questions you have with your health care provider. Document Released: 10/07/2001 Document Revised: 02/03/2019 Document Reviewed: 11/18/2016 Elsevier Patient Education  2020 [employer]  

## 2019-07-14 ENCOUNTER — Encounter: Payer: Medicaid Other | Admitting: Obstetrics and Gynecology

## 2019-07-14 ENCOUNTER — Other Ambulatory Visit: Payer: Medicaid Other

## 2019-07-22 ENCOUNTER — Other Ambulatory Visit: Payer: Medicaid Other

## 2019-07-22 ENCOUNTER — Encounter: Payer: Self-pay | Admitting: Obstetrics & Gynecology

## 2019-07-22 ENCOUNTER — Other Ambulatory Visit: Payer: Self-pay

## 2019-07-22 ENCOUNTER — Ambulatory Visit (INDEPENDENT_AMBULATORY_CARE_PROVIDER_SITE_OTHER): Payer: Medicaid Other | Admitting: Obstetrics & Gynecology

## 2019-07-22 VITALS — BP 100/70 | Wt 193.0 lb

## 2019-07-22 DIAGNOSIS — Z3480 Encounter for supervision of other normal pregnancy, unspecified trimester: Secondary | ICD-10-CM

## 2019-07-22 DIAGNOSIS — Z3483 Encounter for supervision of other normal pregnancy, third trimester: Secondary | ICD-10-CM

## 2019-07-22 DIAGNOSIS — Z3A29 29 weeks gestation of pregnancy: Secondary | ICD-10-CM

## 2019-07-22 LAB — POCT URINALYSIS DIPSTICK OB
Glucose, UA: NEGATIVE
POC,PROTEIN,UA: NEGATIVE

## 2019-07-22 NOTE — Addendum Note (Signed)
Addended by: Quintella Baton D on: 07/22/2019 10:01 AM   Modules accepted: Orders

## 2019-07-22 NOTE — Progress Notes (Signed)
  Subjective  Fetal Movement? yes Contractions? no Leaking Fluid? no Vaginal Bleeding? no  Objective  BP 100/70   Wt 193 lb (87.5 kg)   LMP 12/20/2018 (Within Days)   BMI 31.15 kg/m  General: NAD Pumonary: no increased work of breathing Abdomen: gravid, non-tender Extremities: no edema Psychiatric: mood appropriate, affect full  Assessment  23 y.o. [id number] at [redacted]w[redacted]d by  10/04/2019, by Ultrasound presenting for routine prenatal visit  Plan   Problem List Items Addressed This Visit      Other   Supervision of other normal pregnancy, antepartum    Other Visit Diagnoses    [redacted] weeks gestation of pregnancy    -  Primary      Pregnancy #4 Problems (from 12/20/18 to present)    Problem Noted Resolved   Supervision of other normal pregnancy, antepartum 03/22/2019 by Will Bonnet, MD No   Overview Addendum 07/22/2019  9:54 AM by Gae Dry, MD    [facility] Prenatal Labs  Dating 9 wk u/s Blood type: O/Positive/-- (05/05 1518)   Genetic Screen      NIPS: diploid XX Antibody:Negative (05/05 1518)  Anatomic [place] Normal anatomy scan Rubella: 2.32 (05/05 1518)  Varicella: immune  GTT   Third trimester:  RPR: Non Reactive (05/05 1518)   Rhogam na HBsAg: Negative (05/05 1518)   TDaP vaccine               Flu Shot: HIV: Non Reactive (05/05 1518)   Baby Food                                GBS:   Contraception  Pap:02/2019  CBB  no   CS/VBAC n/a   Support Person            Asthma during pregnancy 03/22/2019 by Will Bonnet, MD No   Overview Signed 03/22/2019 11:26 AM by Will Bonnet, MD    Rarely uses medicine. Continue to monitor.        Glucola today  Barnett Applebaum, MD, [facility], [facility] 07/22/2019  9:59 AM

## 2019-07-23 LAB — 28 WEEK RH+PANEL
Basophils Absolute: 0 10*3/uL (ref 0.0–0.2)
Basos: 0 %
EOS (ABSOLUTE): 0.2 10*3/uL (ref 0.0–0.4)
Eos: 2 %
Gestational Diabetes Screen: 138 mg/dL (ref 65–139)
HIV Screen 4th Generation wRfx: NONREACTIVE
Hematocrit: 35.8 % (ref 34.0–46.6)
Hemoglobin: 11.5 g/dL (ref 11.1–15.9)
Immature Grans (Abs): 0.1 10*3/uL (ref 0.0–0.1)
Immature Granulocytes: 1 %
Lymphocytes Absolute: 1.8 10*3/uL (ref 0.7–3.1)
Lymphs: 25 %
MCH: 27.8 pg (ref 26.6–33.0)
MCHC: 32.1 g/dL (ref 31.5–35.7)
MCV: 87 fL (ref 79–97)
Monocytes Absolute: 0.4 10*3/uL (ref 0.1–0.9)
Monocytes: 6 %
Neutrophils Absolute: 4.6 10*3/uL (ref 1.4–7.0)
Neutrophils: 66 %
Platelets: 163 10*3/uL (ref 150–450)
RBC: 4.13 x10E6/uL (ref 3.77–5.28)
RDW: 14.2 % (ref 11.7–15.4)
RPR Ser Ql: NONREACTIVE
WBC: 7 10*3/uL (ref 3.4–10.8)

## 2019-08-05 ENCOUNTER — Ambulatory Visit (INDEPENDENT_AMBULATORY_CARE_PROVIDER_SITE_OTHER): Payer: Medicaid Other | Admitting: Maternal Newborn

## 2019-08-05 ENCOUNTER — Other Ambulatory Visit: Payer: Self-pay

## 2019-08-05 ENCOUNTER — Encounter: Payer: Self-pay | Admitting: Maternal Newborn

## 2019-08-05 VITALS — BP 110/60 | Wt 195.0 lb

## 2019-08-05 DIAGNOSIS — Z3483 Encounter for supervision of other normal pregnancy, third trimester: Secondary | ICD-10-CM

## 2019-08-05 DIAGNOSIS — Z3A31 31 weeks gestation of pregnancy: Secondary | ICD-10-CM

## 2019-08-05 DIAGNOSIS — Z3480 Encounter for supervision of other normal pregnancy, unspecified trimester: Secondary | ICD-10-CM

## 2019-08-05 NOTE — Progress Notes (Signed)
ROB- no concerns/declines flu shot and will think about TDap but is signing BT consent today

## 2019-08-05 NOTE — Patient Instructions (Signed)
Third Trimester of Pregnancy The third trimester is from week 28 through week 40 (months 7 through 9). The third trimester is a time when the unborn baby (fetus) is growing rapidly. At the end of the ninth month, the fetus is about 20 inches in length and weighs 6-10 pounds. Body changes during your third trimester Your body will continue to go through many changes during pregnancy. The changes vary from woman to woman. During the third trimester:  Your weight will continue to increase. You can expect to gain 25-35 pounds (11-16 kg) by the end of the pregnancy.  You may begin to get stretch marks on your hips, abdomen, and breasts.  You may urinate more often because the fetus is moving lower into your pelvis and pressing on your bladder.  You may develop or continue to have heartburn. This is caused by increased hormones that slow down muscles in the digestive tract.  You may develop or continue to have constipation because increased hormones slow digestion and cause the muscles that push waste through your intestines to relax.  You may develop hemorrhoids. These are swollen veins (varicose veins) in the rectum that can itch or be painful.  You may develop swollen, bulging veins (varicose veins) in your legs.  You may have increased body aches in the pelvis, back, or thighs. This is due to weight gain and increased hormones that are relaxing your joints.  You may have changes in your hair. These can include thickening of your hair, rapid growth, and changes in texture. Some women also have hair loss during or after pregnancy, or hair that feels dry or thin. Your hair will most likely return to normal after your baby is born.  Your breasts will continue to grow and they will continue to become tender. A yellow fluid (colostrum) may leak from your breasts. This is the first milk you are producing for your baby.  Your belly button may stick out.  You may notice more swelling in your hands,  face, or ankles.  You may have increased tingling or numbness in your hands, arms, and legs. The skin on your belly may also feel numb.  You may feel short of breath because of your expanding uterus.  You may have more problems sleeping. This can be caused by the size of your belly, increased need to urinate, and an increase in your body's metabolism.  You may notice the fetus "dropping," or moving lower in your abdomen (lightening).  You may have increased vaginal discharge.  You may notice your joints feel loose and you may have pain around your pelvic bone. What to expect at prenatal visits You will have prenatal exams every 2 weeks until week 36. Then you will have weekly prenatal exams. During a routine prenatal visit:  You will be weighed to make sure you and the baby are growing normally.  Your blood pressure will be taken.  Your abdomen will be measured to track your baby's growth.  The fetal heartbeat will be listened to.  Any test results from the previous visit will be discussed.  You may have a cervical check near your due date to see if your cervix has softened or thinned (effaced).  You will be tested for Group B streptococcus. This happens between 35 and 37 weeks. Your health care provider may ask you:  What your birth plan is.  How you are feeling.  If you are feeling the baby move.  If you have had any abnormal   symptoms, such as leaking fluid, bleeding, severe headaches, or abdominal cramping.  If you are using any tobacco products, including cigarettes, chewing tobacco, and electronic cigarettes.  If you have any questions. Other tests or screenings that may be performed during your third trimester include:  Blood tests that check for low iron levels (anemia).  Fetal testing to check the health, activity level, and growth of the fetus. Testing is done if you have certain medical conditions or if there are problems during the pregnancy.  Nonstress test  (NST). This test checks the health of your baby to make sure there are no signs of problems, such as the baby not getting enough oxygen. During this test, a belt is placed around your belly. The baby is made to move, and its heart rate is monitored during movement. What is false labor? False labor is a condition in which you feel small, irregular tightenings of the muscles in the womb (contractions) that usually go away with rest, changing position, or drinking water. These are called Braxton Hicks contractions. Contractions may last for hours, days, or even weeks before true labor sets in. If contractions come at regular intervals, become more frequent, increase in intensity, or become painful, you should see your health care provider. What are the signs of labor?  Abdominal cramps.  Regular contractions that start at 10 minutes apart and become stronger and more frequent with time.  Contractions that start on the top of the uterus and spread down to the lower abdomen and back.  Increased pelvic pressure and dull back pain.  A watery or bloody mucus discharge that comes from the vagina.  Leaking of amniotic fluid. This is also known as your "water breaking." It could be a slow trickle or a gush. Let your health care provider know if it has a color or strange odor. If you have any of these signs, call your health care provider right away, even if it is before your due date. Follow these instructions at home: Medicines  Follow your health care provider's instructions regarding medicine use. Specific medicines may be either safe or unsafe to take during pregnancy.  Take a prenatal vitamin that contains at least 600 micrograms (mcg) of folic acid.  If you develop constipation, try taking a stool softener if your health care provider approves. Eating and drinking   Eat a balanced diet that includes fresh fruits and vegetables, whole grains, good sources of protein such as meat, eggs, or tofu,  and low-fat dairy. Your health care provider will help you determine the amount of weight gain that is right for you.  Avoid raw meat and uncooked cheese. These carry germs that can cause birth defects in the baby.  If you have low calcium intake from food, talk to your health care provider about whether you should take a daily calcium supplement.  Eat four or five small meals rather than three large meals a day.  Limit foods that are high in fat and processed sugars, such as fried and sweet foods.  To prevent constipation: ? Drink enough fluid to keep your urine clear or pale yellow. ? Eat foods that are high in fiber, such as fresh fruits and vegetables, whole grains, and beans. Activity  Exercise only as directed by your health care provider. Most women can continue their usual exercise routine during pregnancy. Try to exercise for 30 minutes at least 5 days a week. Stop exercising if you experience uterine contractions.  Avoid heavy lifting.  Do   not exercise in extreme heat or humidity, or at high altitudes.  Wear low-heel, comfortable shoes.  Practice good posture.  You may continue to have sex unless your health care provider tells you otherwise. Relieving pain and discomfort  Take frequent breaks and rest with your legs elevated if you have leg cramps or low back pain.  Take warm sitz baths to soothe any pain or discomfort caused by hemorrhoids. Use hemorrhoid cream if your health care provider approves.  Wear a good support bra to prevent discomfort from breast tenderness.  If you develop varicose veins: ? Wear support pantyhose or compression stockings as told by your healthcare provider. ? Elevate your feet for 15 minutes, 3-4 times a day. Prenatal care  Write down your questions. Take them to your prenatal visits.  Keep all your prenatal visits as told by your health care provider. This is important. Safety  Wear your seat belt at all times when driving.  Make  a list of emergency phone numbers, including numbers for family, friends, the hospital, and police and fire departments. General instructions  Avoid cat litter boxes and soil used by cats. These carry germs that can cause birth defects in the baby. If you have a cat, ask someone to clean the litter box for you.  Do not travel far distances unless it is absolutely necessary and only with the approval of your health care provider.  Do not use hot tubs, steam rooms, or saunas.  Do not drink alcohol.  Do not use any products that contain nicotine or tobacco, such as cigarettes and e-cigarettes. If you need help quitting, ask your health care provider.  Do not use any medicinal herbs or unprescribed drugs. These chemicals affect the formation and growth of the baby.  Do not douche or use tampons or scented sanitary pads.  Do not cross your legs for long periods of time.  To prepare for the arrival of your baby: ? Take prenatal classes to understand, practice, and ask questions about labor and delivery. ? Make a trial run to the hospital. ? Visit the hospital and tour the maternity area. ? Arrange for maternity or paternity leave through employers. ? Arrange for family and friends to take care of pets while you are in the hospital. ? Purchase a rear-facing car seat and make sure you know how to install it in your car. ? Pack your hospital bag. ? Prepare the baby's nursery. Make sure to remove all pillows and stuffed animals from the baby's crib to prevent suffocation.  Visit your dentist if you have not gone during your pregnancy. Use a soft toothbrush to brush your teeth and be gentle when you floss. Contact a health care provider if:  You are unsure if you are in labor or if your water has broken.  You become dizzy.  You have mild pelvic cramps, pelvic pressure, or nagging pain in your abdominal area.  You have lower back pain.  You have persistent nausea, vomiting, or diarrhea.   You have an unusual or bad smelling vaginal discharge.  You have pain when you urinate. Get help right away if:  Your water breaks before 37 weeks.  You have regular contractions less than 5 minutes apart before 37 weeks.  You have a fever.  You are leaking fluid from your vagina.  You have spotting or bleeding from your vagina.  You have severe abdominal pain or cramping.  You have rapid weight loss or weight gain.  You have   shortness of breath with chest pain.  You notice sudden or extreme swelling of your face, hands, ankles, feet, or legs.  Your baby makes fewer than 10 movements in 2 hours.  You have severe headaches that do not go away when you take medicine.  You have vision changes. Summary  The third trimester is from week 28 through week 40, months 7 through 9. The third trimester is a time when the unborn baby (fetus) is growing rapidly.  During the third trimester, your discomfort may increase as you and your baby continue to gain weight. You may have abdominal, leg, and back pain, sleeping problems, and an increased need to urinate.  During the third trimester your breasts will keep growing and they will continue to become tender. A yellow fluid (colostrum) may leak from your breasts. This is the first milk you are producing for your baby.  False labor is a condition in which you feel small, irregular tightenings of the muscles in the womb (contractions) that eventually go away. These are called Braxton Hicks contractions. Contractions may last for hours, days, or even weeks before true labor sets in.  Signs of labor can include: abdominal cramps; regular contractions that start at 10 minutes apart and become stronger and more frequent with time; watery or bloody mucus discharge that comes from the vagina; increased pelvic pressure and dull back pain; and leaking of amniotic fluid. This information is not intended to replace advice given to you by your health  care provider. Make sure you discuss any questions you have with your health care provider. Document Released: 10/07/2001 Document Revised: 02/03/2019 Document Reviewed: 11/18/2016 Elsevier Patient Education  2020 [employer]  

## 2019-08-05 NOTE — Progress Notes (Signed)
    Routine Prenatal Care Visit  Subjective  Carmen Morales is a 23 y.o. [phone] at [redacted]w[redacted]d being seen today for ongoing prenatal care.  She is currently monitored for the following issues for this low-risk pregnancy and has Supervision of other normal pregnancy, antepartum and Asthma during pregnancy on their problem list.  ----------------------------------------------------------------------------------- Patient reports no complaints.   Contractions: Not present. Vag. Bleeding: None.  Movement: Present. No leaking of fluid.  ----------------------------------------------------------------------------------- The following portions of the patient's history were reviewed and updated as appropriate: allergies, current medications, past family history, past medical history, past social history, past surgical history and problem list. Problem list updated.   Objective  Blood pressure 110/60, weight 195 lb (88.5 kg), last menstrual period 12/20/2018. Pregravid weight 186 lb (84.4 kg) Total Weight Gain 9 lb (4.082 kg)  Fetal Status: Fetal Heart Rate (bpm): 140 Fundal Height: 32 cm Movement: Present     General:  Alert, oriented and cooperative. Patient is in no acute distress.  Skin: Skin is warm and dry. No rash noted.   Cardiovascular: Normal heart rate noted  Respiratory: Normal respiratory effort, no problems with respiration noted  Abdomen: Soft, gravid, appropriate for gestational age. Pain/Pressure: Absent     Pelvic:  Cervical exam deferred        Extremities: Normal range of motion.  Edema: None  Mental Status: Normal mood and affect. Normal behavior. Normal judgment and thought content.     Assessment   23 y.o. [id number] at [redacted]w[redacted]d, EDD 10/04/2019 by Ultrasound presenting for a routine prenatal visit.  Plan   Pregnancy #4 Problems (from 12/20/18 to present)    Problem Noted Resolved   Supervision of other normal pregnancy, antepartum 03/22/2019 by Will Bonnet, MD No   Overview Addendum 07/22/2019  9:54 AM by Gae Dry, MD    [facility] Prenatal Labs  Dating 9 wk u/s Blood type: O/Positive/-- (05/05 1518)   Genetic Screen      NIPS: diploid XX Antibody:Negative (05/05 1518)  Anatomic [place] Normal anatomy scan Rubella: 2.32 (05/05 1518)  Varicella: immune  GTT   Third trimester:  RPR: Non Reactive (05/05 1518)   Rhogam na HBsAg: Negative (05/05 1518)   TDaP vaccine               Flu Shot: HIV: Non Reactive (05/05 1518)   Baby Food                                GBS:   Contraception  Pap:02/2019  CBB  no   CS/VBAC n/a   Support Person            Asthma during pregnancy 03/22/2019 by Will Bonnet, MD No   Overview Signed 03/22/2019 11:26 AM by Will Bonnet, MD    Rarely uses medicine. Continue to monitor.       Discussed lab results and watching dietary sugar and simple carbohydrates.   Preterm labor symptoms and general obstetric precautions including but not limited to vaginal bleeding, contractions, leaking of fluid and fetal movement were reviewed.  Please refer to After Visit Summary for other counseling recommendations.   Return in about 2 weeks (around 08/19/2019) for ROB.  Avel Sensor, CNM 08/05/2019  11:53 AM

## 2019-08-19 ENCOUNTER — Other Ambulatory Visit: Payer: Self-pay

## 2019-08-19 ENCOUNTER — Ambulatory Visit (INDEPENDENT_AMBULATORY_CARE_PROVIDER_SITE_OTHER): Payer: Medicaid Other | Admitting: Maternal Newborn

## 2019-08-19 ENCOUNTER — Encounter: Payer: Self-pay | Admitting: Maternal Newborn

## 2019-08-19 VITALS — BP 120/80 | Wt 196.0 lb

## 2019-08-19 DIAGNOSIS — Z3689 Encounter for other specified antenatal screening: Secondary | ICD-10-CM

## 2019-08-19 DIAGNOSIS — Z3483 Encounter for supervision of other normal pregnancy, third trimester: Secondary | ICD-10-CM

## 2019-08-19 DIAGNOSIS — Z3A33 33 weeks gestation of pregnancy: Secondary | ICD-10-CM

## 2019-08-19 DIAGNOSIS — Z3480 Encounter for supervision of other normal pregnancy, unspecified trimester: Secondary | ICD-10-CM

## 2019-08-19 LAB — POCT URINALYSIS DIPSTICK OB
Glucose, UA: NEGATIVE
POC,PROTEIN,UA: NEGATIVE

## 2019-08-19 NOTE — Progress Notes (Signed)
    Routine Prenatal Care Visit  Subjective  Carmen Morales is a 23 y.o. [phone] at [redacted]w[redacted]d being seen today for ongoing prenatal care.  She is currently monitored for the following issues for this low-risk pregnancy and has Supervision of other normal pregnancy, antepartum and Asthma during pregnancy on their problem list.  ----------------------------------------------------------------------------------- Patient reports no complaints.   Contractions: Not present. Vag. Bleeding: None.  Movement: Present. No leaking of fluid.  ----------------------------------------------------------------------------------- The following portions of the patient's history were reviewed and updated as appropriate: allergies, current medications, past family history, past medical history, past social history, past surgical history and problem list. Problem list updated.   Objective  Blood pressure 120/80, weight 196 lb (88.9 kg), last menstrual period 12/20/2018. Pregravid weight 186 lb (84.4 kg) Total Weight Gain 10 lb (4.536 kg)  Fetal Status: Fetal Heart Rate (bpm): 130 Fundal Height: 36 cm Movement: Present     General:  Alert, oriented and cooperative. Patient is in no acute distress.  Skin: Skin is warm and dry. No rash noted.   Cardiovascular: Normal heart rate noted  Respiratory: Normal respiratory effort, no problems with respiration noted  Abdomen: Soft, gravid, appropriate for gestational age. Pain/Pressure: Absent     Pelvic:  Cervical exam deferred        Extremities: Normal range of motion.  Edema: None  Mental Status: Normal mood and affect. Normal behavior. Normal judgment and thought content.     Assessment   23 y.o. [id number] at [redacted]w[redacted]d, EDD 10/04/2019 by Ultrasound presenting for a routine prenatal visit.  Plan   Pregnancy #4 Problems (from 12/20/18 to present)    Problem Noted Resolved   Supervision of other normal pregnancy, antepartum 03/22/2019 by Will Bonnet, MD No   Overview Addendum 07/22/2019  9:54 AM by Gae Dry, MD    [facility] Prenatal Labs  Dating 9 wk u/s Blood type: O/Positive/-- (05/05 1518)   Genetic Screen      NIPS: diploid XX Antibody:Negative (05/05 1518)  Anatomic [place] Normal anatomy scan Rubella: 2.32 (05/05 1518)  Varicella: immune  GTT   Third trimester:  RPR: Non Reactive (05/05 1518)   Rhogam na HBsAg: Negative (05/05 1518)   TDaP vaccine               Flu Shot: HIV: Non Reactive (05/05 1518)   Baby Food                                GBS:   Contraception  Pap:02/2019  CBB  no   CS/VBAC n/a   Support Person            Asthma during pregnancy 03/22/2019 by Will Bonnet, MD No   Overview Signed 03/22/2019 11:26 AM by Will Bonnet, MD    Rarely uses medicine. Continue to monitor.       Growth scan next time for fundal height greater than dates.   Please refer to After Visit Summary for other counseling recommendations.   Return in about 2 weeks (around 09/02/2019) for ROB and growth scan.  Avel Sensor, CNM 08/19/2019  11:57 AM

## 2019-08-19 NOTE — Patient Instructions (Signed)
Third Trimester of Pregnancy The third trimester is from week 28 through week 40 (months 7 through 9). The third trimester is a time when the unborn baby (fetus) is growing rapidly. At the end of the ninth month, the fetus is about 20 inches in length and weighs 6-10 pounds. Body changes during your third trimester Your body will continue to go through many changes during pregnancy. The changes vary from woman to woman. During the third trimester:  Your weight will continue to increase. You can expect to gain 25-35 pounds (11-16 kg) by the end of the pregnancy.  You may begin to get stretch marks on your hips, abdomen, and breasts.  You may urinate more often because the fetus is moving lower into your pelvis and pressing on your bladder.  You may develop or continue to have heartburn. This is caused by increased hormones that slow down muscles in the digestive tract.  You may develop or continue to have constipation because increased hormones slow digestion and cause the muscles that push waste through your intestines to relax.  You may develop hemorrhoids. These are swollen veins (varicose veins) in the rectum that can itch or be painful.  You may develop swollen, bulging veins (varicose veins) in your legs.  You may have increased body aches in the pelvis, back, or thighs. This is due to weight gain and increased hormones that are relaxing your joints.  You may have changes in your hair. These can include thickening of your hair, rapid growth, and changes in texture. Some women also have hair loss during or after pregnancy, or hair that feels dry or thin. Your hair will most likely return to normal after your baby is born.  Your breasts will continue to grow and they will continue to become tender. A yellow fluid (colostrum) may leak from your breasts. This is the first milk you are producing for your baby.  Your belly button may stick out.  You may notice more swelling in your hands,  face, or ankles.  You may have increased tingling or numbness in your hands, arms, and legs. The skin on your belly may also feel numb.  You may feel short of breath because of your expanding uterus.  You may have more problems sleeping. This can be caused by the size of your belly, increased need to urinate, and an increase in your body's metabolism.  You may notice the fetus "dropping," or moving lower in your abdomen (lightening).  You may have increased vaginal discharge.  You may notice your joints feel loose and you may have pain around your pelvic bone. What to expect at prenatal visits You will have prenatal exams every 2 weeks until week 36. Then you will have weekly prenatal exams. During a routine prenatal visit:  You will be weighed to make sure you and the baby are growing normally.  Your blood pressure will be taken.  Your abdomen will be measured to track your baby's growth.  The fetal heartbeat will be listened to.  Any test results from the previous visit will be discussed.  You may have a cervical check near your due date to see if your cervix has softened or thinned (effaced).  You will be tested for Group B streptococcus. This happens between 35 and 37 weeks. Your health care provider may ask you:  What your birth plan is.  How you are feeling.  If you are feeling the baby move.  If you have had any abnormal   symptoms, such as leaking fluid, bleeding, severe headaches, or abdominal cramping.  If you are using any tobacco products, including cigarettes, chewing tobacco, and electronic cigarettes.  If you have any questions. Other tests or screenings that may be performed during your third trimester include:  Blood tests that check for low iron levels (anemia).  Fetal testing to check the health, activity level, and growth of the fetus. Testing is done if you have certain medical conditions or if there are problems during the pregnancy.  Nonstress test  (NST). This test checks the health of your baby to make sure there are no signs of problems, such as the baby not getting enough oxygen. During this test, a belt is placed around your belly. The baby is made to move, and its heart rate is monitored during movement. What is false labor? False labor is a condition in which you feel small, irregular tightenings of the muscles in the womb (contractions) that usually go away with rest, changing position, or drinking water. These are called Braxton Hicks contractions. Contractions may last for hours, days, or even weeks before true labor sets in. If contractions come at regular intervals, become more frequent, increase in intensity, or become painful, you should see your health care provider. What are the signs of labor?  Abdominal cramps.  Regular contractions that start at 10 minutes apart and become stronger and more frequent with time.  Contractions that start on the top of the uterus and spread down to the lower abdomen and back.  Increased pelvic pressure and dull back pain.  A watery or bloody mucus discharge that comes from the vagina.  Leaking of amniotic fluid. This is also known as your "water breaking." It could be a slow trickle or a gush. Let your health care provider know if it has a color or strange odor. If you have any of these signs, call your health care provider right away, even if it is before your due date. Follow these instructions at home: Medicines  Follow your health care provider's instructions regarding medicine use. Specific medicines may be either safe or unsafe to take during pregnancy.  Take a prenatal vitamin that contains at least 600 micrograms (mcg) of folic acid.  If you develop constipation, try taking a stool softener if your health care provider approves. Eating and drinking   Eat a balanced diet that includes fresh fruits and vegetables, whole grains, good sources of protein such as meat, eggs, or tofu,  and low-fat dairy. Your health care provider will help you determine the amount of weight gain that is right for you.  Avoid raw meat and uncooked cheese. These carry germs that can cause birth defects in the baby.  If you have low calcium intake from food, talk to your health care provider about whether you should take a daily calcium supplement.  Eat four or five small meals rather than three large meals a day.  Limit foods that are high in fat and processed sugars, such as fried and sweet foods.  To prevent constipation: ? Drink enough fluid to keep your urine clear or pale yellow. ? Eat foods that are high in fiber, such as fresh fruits and vegetables, whole grains, and beans. Activity  Exercise only as directed by your health care provider. Most women can continue their usual exercise routine during pregnancy. Try to exercise for 30 minutes at least 5 days a week. Stop exercising if you experience uterine contractions.  Avoid heavy lifting.  Do   not exercise in extreme heat or humidity, or at high altitudes.  Wear low-heel, comfortable shoes.  Practice good posture.  You may continue to have sex unless your health care provider tells you otherwise. Relieving pain and discomfort  Take frequent breaks and rest with your legs elevated if you have leg cramps or low back pain.  Take warm sitz baths to soothe any pain or discomfort caused by hemorrhoids. Use hemorrhoid cream if your health care provider approves.  Wear a good support bra to prevent discomfort from breast tenderness.  If you develop varicose veins: ? Wear support pantyhose or compression stockings as told by your healthcare provider. ? Elevate your feet for 15 minutes, 3-4 times a day. Prenatal care  Write down your questions. Take them to your prenatal visits.  Keep all your prenatal visits as told by your health care provider. This is important. Safety  Wear your seat belt at all times when driving.  Make  a list of emergency phone numbers, including numbers for family, friends, the hospital, and police and fire departments. General instructions  Avoid cat litter boxes and soil used by cats. These carry germs that can cause birth defects in the baby. If you have a cat, ask someone to clean the litter box for you.  Do not travel far distances unless it is absolutely necessary and only with the approval of your health care provider.  Do not use hot tubs, steam rooms, or saunas.  Do not drink alcohol.  Do not use any products that contain nicotine or tobacco, such as cigarettes and e-cigarettes. If you need help quitting, ask your health care provider.  Do not use any medicinal herbs or unprescribed drugs. These chemicals affect the formation and growth of the baby.  Do not douche or use tampons or scented sanitary pads.  Do not cross your legs for long periods of time.  To prepare for the arrival of your baby: ? Take prenatal classes to understand, practice, and ask questions about labor and delivery. ? Make a trial run to the hospital. ? Visit the hospital and tour the maternity area. ? Arrange for maternity or paternity leave through employers. ? Arrange for family and friends to take care of pets while you are in the hospital. ? Purchase a rear-facing car seat and make sure you know how to install it in your car. ? Pack your hospital bag. ? Prepare the baby's nursery. Make sure to remove all pillows and stuffed animals from the baby's crib to prevent suffocation.  Visit your dentist if you have not gone during your pregnancy. Use a soft toothbrush to brush your teeth and be gentle when you floss. Contact a health care provider if:  You are unsure if you are in labor or if your water has broken.  You become dizzy.  You have mild pelvic cramps, pelvic pressure, or nagging pain in your abdominal area.  You have lower back pain.  You have persistent nausea, vomiting, or diarrhea.   You have an unusual or bad smelling vaginal discharge.  You have pain when you urinate. Get help right away if:  Your water breaks before 37 weeks.  You have regular contractions less than 5 minutes apart before 37 weeks.  You have a fever.  You are leaking fluid from your vagina.  You have spotting or bleeding from your vagina.  You have severe abdominal pain or cramping.  You have rapid weight loss or weight gain.  You have   shortness of breath with chest pain.  You notice sudden or extreme swelling of your face, hands, ankles, feet, or legs.  Your baby makes fewer than 10 movements in 2 hours.  You have severe headaches that do not go away when you take medicine.  You have vision changes. Summary  The third trimester is from week 28 through week 40, months 7 through 9. The third trimester is a time when the unborn baby (fetus) is growing rapidly.  During the third trimester, your discomfort may increase as you and your baby continue to gain weight. You may have abdominal, leg, and back pain, sleeping problems, and an increased need to urinate.  During the third trimester your breasts will keep growing and they will continue to become tender. A yellow fluid (colostrum) may leak from your breasts. This is the first milk you are producing for your baby.  False labor is a condition in which you feel small, irregular tightenings of the muscles in the womb (contractions) that eventually go away. These are called Braxton Hicks contractions. Contractions may last for hours, days, or even weeks before true labor sets in.  Signs of labor can include: abdominal cramps; regular contractions that start at 10 minutes apart and become stronger and more frequent with time; watery or bloody mucus discharge that comes from the vagina; increased pelvic pressure and dull back pain; and leaking of amniotic fluid. This information is not intended to replace advice given to you by your health  care provider. Make sure you discuss any questions you have with your health care provider. Document Released: 10/07/2001 Document Revised: 02/03/2019 Document Reviewed: 11/18/2016 Elsevier Patient Education  2020 [employer]  

## 2019-08-19 NOTE — Addendum Note (Signed)
Addended by: Quintella Baton D on: 08/19/2019 02:10 PM   Modules accepted: Orders

## 2019-08-30 ENCOUNTER — Ambulatory Visit (INDEPENDENT_AMBULATORY_CARE_PROVIDER_SITE_OTHER): Payer: Medicaid Other

## 2019-08-30 ENCOUNTER — Other Ambulatory Visit: Payer: Self-pay

## 2019-08-30 ENCOUNTER — Ambulatory Visit (INDEPENDENT_AMBULATORY_CARE_PROVIDER_SITE_OTHER): Payer: Medicaid Other | Admitting: Maternal Newborn

## 2019-08-30 VITALS — BP 100/46 | Wt 197.0 lb

## 2019-08-30 DIAGNOSIS — Z362 Encounter for other antenatal screening follow-up: Secondary | ICD-10-CM | POA: Diagnosis not present

## 2019-08-30 DIAGNOSIS — O3660X0 Maternal care for excessive fetal growth, unspecified trimester, not applicable or unspecified: Secondary | ICD-10-CM

## 2019-08-30 DIAGNOSIS — O3663X0 Maternal care for excessive fetal growth, third trimester, not applicable or unspecified: Secondary | ICD-10-CM

## 2019-08-30 DIAGNOSIS — Z3A35 35 weeks gestation of pregnancy: Secondary | ICD-10-CM

## 2019-08-30 DIAGNOSIS — Z3689 Encounter for other specified antenatal screening: Secondary | ICD-10-CM

## 2019-08-30 DIAGNOSIS — Z3480 Encounter for supervision of other normal pregnancy, unspecified trimester: Secondary | ICD-10-CM

## 2019-08-30 NOTE — Progress Notes (Signed)
Routine Prenatal Care Visit  Subjective  Carmen Morales Maryjean Morn is a 23 y.o. [phone] at [redacted]w[redacted]d being seen today for ongoing prenatal care.  She is currently monitored for the following issues for this low-risk pregnancy and has Supervision of other normal pregnancy, antepartum and Asthma during pregnancy on their problem list.  ----------------------------------------------------------------------------------- Patient reports no complaints.   Contractions: Not present. Vag. Bleeding: None.  Movement: Present. No leaking of fluid.  ----------------------------------------------------------------------------------- The following portions of the patient's history were reviewed and updated as appropriate: allergies, current medications, past family history, past medical history, past social history, past surgical history and problem list. Problem list updated.   Objective  Blood pressure (!) 100/46, weight 197 lb (89.4 kg), last menstrual period 12/20/2018. Pregravid weight 186 lb (84.4 kg) Total Weight Gain 11 lb (4.99 kg)  Fetal Status: Fetal Heart Rate (bpm): 152 ([place])   Movement: Present     General:  Alert, oriented and cooperative. Patient is in no acute distress.  Skin: Skin is warm and dry. No rash noted.   Cardiovascular: Normal heart rate noted  Respiratory: Normal respiratory effort, no problems with respiration noted  Abdomen: Soft, gravid, appropriate for gestational age. Pain/Pressure: Absent     Pelvic:  Cervical exam deferred        Extremities: Normal range of motion.  Edema: None  Mental Status: Normal mood and affect. Normal behavior. Normal judgment and thought content.     Assessment   23 y.o. [id number] at [redacted]w[redacted]d EDD 10/04/2019, by Ultrasound presenting for a routine prenatal visit.  Plan   Pregnancy #4 Problems (from 12/20/18 to present)    Problem Noted Resolved   Supervision of other normal pregnancy, antepartum 03/22/2019 by Will Bonnet, MD No   Overview  Addendum 07/22/2019  9:54 AM by Gae Dry, MD    [facility] Prenatal Labs  Dating 9 wk u/s Blood type: O/Positive/-- (05/05 1518)   Genetic Screen      NIPS: diploid XX Antibody:Negative (05/05 1518)  Anatomic [place] Normal anatomy scan Rubella: 2.32 (05/05 1518)  Varicella: immune  GTT   Third trimester:  RPR: Non Reactive (05/05 1518)   Rhogam na HBsAg: Negative (05/05 1518)   TDaP vaccine               Flu Shot: HIV: Non Reactive (05/05 1518)   Baby Food                                GBS:   Contraception  Pap:02/2019  CBB  no   CS/VBAC n/a   Support Person            Asthma during pregnancy 03/22/2019 by Will Bonnet, MD No   Overview Signed 03/22/2019 11:26 AM by Will Bonnet, MD    Rarely uses medicine. Continue to monitor.       Growth scan today shows fetal growth at 86.9%, EFW 7 lb, 2 oz. Cephalic presentation, FHR 152 bpm. There is a 6 cm cyst in the fetal LLQ. MD recommendation is to follow up with MFM due to this cyst, which was not present on the fetal anatomy scan. Results were reviewed with patient and she is aware of consult. Referral sent.  34 week instructions given.  Preterm labor symptoms and general obstetric precautions including but not limited to vaginal bleeding, contractions, leaking of fluid and fetal movement were reviewed.  Please refer to  After Visit Summary for other counseling recommendations.   Return in about 1 week (around 09/06/2019) for ROB.  Marcelyn Bruins, CNM 08/30/2019  2:33 PM

## 2019-08-30 NOTE — Patient Instructions (Signed)
Third Trimester of Pregnancy The third trimester is from week 28 through week 40 (months 7 through 9). The third trimester is a time when the unborn baby (fetus) is growing rapidly. At the end of the ninth month, the fetus is about 20 inches in length and weighs 6-10 pounds. Body changes during your third trimester Your body will continue to go through many changes during pregnancy. The changes vary from woman to woman. During the third trimester:  Your weight will continue to increase. You can expect to gain 25-35 pounds (11-16 kg) by the end of the pregnancy.  You may begin to get stretch marks on your hips, abdomen, and breasts.  You may urinate more often because the fetus is moving lower into your pelvis and pressing on your bladder.  You may develop or continue to have heartburn. This is caused by increased hormones that slow down muscles in the digestive tract.  You may develop or continue to have constipation because increased hormones slow digestion and cause the muscles that push waste through your intestines to relax.  You may develop hemorrhoids. These are swollen veins (varicose veins) in the rectum that can itch or be painful.  You may develop swollen, bulging veins (varicose veins) in your legs.  You may have increased body aches in the pelvis, back, or thighs. This is due to weight gain and increased hormones that are relaxing your joints.  You may have changes in your hair. These can include thickening of your hair, rapid growth, and changes in texture. Some women also have hair loss during or after pregnancy, or hair that feels dry or thin. Your hair will most likely return to normal after your baby is born.  Your breasts will continue to grow and they will continue to become tender. A yellow fluid (colostrum) may leak from your breasts. This is the first milk you are producing for your baby.  Your belly button may stick out.  You may notice more swelling in your hands,  face, or ankles.  You may have increased tingling or numbness in your hands, arms, and legs. The skin on your belly may also feel numb.  You may feel short of breath because of your expanding uterus.  You may have more problems sleeping. This can be caused by the size of your belly, increased need to urinate, and an increase in your body's metabolism.  You may notice the fetus "dropping," or moving lower in your abdomen (lightening).  You may have increased vaginal discharge.  You may notice your joints feel loose and you may have pain around your pelvic bone. What to expect at prenatal visits You will have prenatal exams every 2 weeks until week 36. Then you will have weekly prenatal exams. During a routine prenatal visit:  You will be weighed to make sure you and the baby are growing normally.  Your blood pressure will be taken.  Your abdomen will be measured to track your baby's growth.  The fetal heartbeat will be listened to.  Any test results from the previous visit will be discussed.  You may have a cervical check near your due date to see if your cervix has softened or thinned (effaced).  You will be tested for Group B streptococcus. This happens between 35 and 37 weeks. Your health care provider may ask you:  What your birth plan is.  How you are feeling.  If you are feeling the baby move.  If you have had any abnormal   symptoms, such as leaking fluid, bleeding, severe headaches, or abdominal cramping.  If you are using any tobacco products, including cigarettes, chewing tobacco, and electronic cigarettes.  If you have any questions. Other tests or screenings that may be performed during your third trimester include:  Blood tests that check for low iron levels (anemia).  Fetal testing to check the health, activity level, and growth of the fetus. Testing is done if you have certain medical conditions or if there are problems during the pregnancy.  Nonstress test  (NST). This test checks the health of your baby to make sure there are no signs of problems, such as the baby not getting enough oxygen. During this test, a belt is placed around your belly. The baby is made to move, and its heart rate is monitored during movement. What is false labor? False labor is a condition in which you feel small, irregular tightenings of the muscles in the womb (contractions) that usually go away with rest, changing position, or drinking water. These are called Braxton Hicks contractions. Contractions may last for hours, days, or even weeks before true labor sets in. If contractions come at regular intervals, become more frequent, increase in intensity, or become painful, you should see your health care provider. What are the signs of labor?  Abdominal cramps.  Regular contractions that start at 10 minutes apart and become stronger and more frequent with time.  Contractions that start on the top of the uterus and spread down to the lower abdomen and back.  Increased pelvic pressure and dull back pain.  A watery or bloody mucus discharge that comes from the vagina.  Leaking of amniotic fluid. This is also known as your "water breaking." It could be a slow trickle or a gush. Let your health care provider know if it has a color or strange odor. If you have any of these signs, call your health care provider right away, even if it is before your due date. Follow these instructions at home: Medicines  Follow your health care provider's instructions regarding medicine use. Specific medicines may be either safe or unsafe to take during pregnancy.  Take a prenatal vitamin that contains at least 600 micrograms (mcg) of folic acid.  If you develop constipation, try taking a stool softener if your health care provider approves. Eating and drinking   Eat a balanced diet that includes fresh fruits and vegetables, whole grains, good sources of protein such as meat, eggs, or tofu,  and low-fat dairy. Your health care provider will help you determine the amount of weight gain that is right for you.  Avoid raw meat and uncooked cheese. These carry germs that can cause birth defects in the baby.  If you have low calcium intake from food, talk to your health care provider about whether you should take a daily calcium supplement.  Eat four or five small meals rather than three large meals a day.  Limit foods that are high in fat and processed sugars, such as fried and sweet foods.  To prevent constipation: ? Drink enough fluid to keep your urine clear or pale yellow. ? Eat foods that are high in fiber, such as fresh fruits and vegetables, whole grains, and beans. Activity  Exercise only as directed by your health care provider. Most women can continue their usual exercise routine during pregnancy. Try to exercise for 30 minutes at least 5 days a week. Stop exercising if you experience uterine contractions.  Avoid heavy lifting.  Do   not exercise in extreme heat or humidity, or at high altitudes.  Wear low-heel, comfortable shoes.  Practice good posture.  You may continue to have sex unless your health care provider tells you otherwise. Relieving pain and discomfort  Take frequent breaks and rest with your legs elevated if you have leg cramps or low back pain.  Take warm sitz baths to soothe any pain or discomfort caused by hemorrhoids. Use hemorrhoid cream if your health care provider approves.  Wear a good support bra to prevent discomfort from breast tenderness.  If you develop varicose veins: ? Wear support pantyhose or compression stockings as told by your healthcare provider. ? Elevate your feet for 15 minutes, 3-4 times a day. Prenatal care  Write down your questions. Take them to your prenatal visits.  Keep all your prenatal visits as told by your health care provider. This is important. Safety  Wear your seat belt at all times when driving.  Make  a list of emergency phone numbers, including numbers for family, friends, the hospital, and police and fire departments. General instructions  Avoid cat litter boxes and soil used by cats. These carry germs that can cause birth defects in the baby. If you have a cat, ask someone to clean the litter box for you.  Do not travel far distances unless it is absolutely necessary and only with the approval of your health care provider.  Do not use hot tubs, steam rooms, or saunas.  Do not drink alcohol.  Do not use any products that contain nicotine or tobacco, such as cigarettes and e-cigarettes. If you need help quitting, ask your health care provider.  Do not use any medicinal herbs or unprescribed drugs. These chemicals affect the formation and growth of the baby.  Do not douche or use tampons or scented sanitary pads.  Do not cross your legs for long periods of time.  To prepare for the arrival of your baby: ? Take prenatal classes to understand, practice, and ask questions about labor and delivery. ? Make a trial run to the hospital. ? Visit the hospital and tour the maternity area. ? Arrange for maternity or paternity leave through employers. ? Arrange for family and friends to take care of pets while you are in the hospital. ? Purchase a rear-facing car seat and make sure you know how to install it in your car. ? Pack your hospital bag. ? Prepare the baby's nursery. Make sure to remove all pillows and stuffed animals from the baby's crib to prevent suffocation.  Visit your dentist if you have not gone during your pregnancy. Use a soft toothbrush to brush your teeth and be gentle when you floss. Contact a health care provider if:  You are unsure if you are in labor or if your water has broken.  You become dizzy.  You have mild pelvic cramps, pelvic pressure, or nagging pain in your abdominal area.  You have lower back pain.  You have persistent nausea, vomiting, or diarrhea.   You have an unusual or bad smelling vaginal discharge.  You have pain when you urinate. Get help right away if:  Your water breaks before 37 weeks.  You have regular contractions less than 5 minutes apart before 37 weeks.  You have a fever.  You are leaking fluid from your vagina.  You have spotting or bleeding from your vagina.  You have severe abdominal pain or cramping.  You have rapid weight loss or weight gain.  You have   shortness of breath with chest pain.  You notice sudden or extreme swelling of your face, hands, ankles, feet, or legs.  Your baby makes fewer than 10 movements in 2 hours.  You have severe headaches that do not go away when you take medicine.  You have vision changes. Summary  The third trimester is from week 28 through week 40, months 7 through 9. The third trimester is a time when the unborn baby (fetus) is growing rapidly.  During the third trimester, your discomfort may increase as you and your baby continue to gain weight. You may have abdominal, leg, and back pain, sleeping problems, and an increased need to urinate.  During the third trimester your breasts will keep growing and they will continue to become tender. A yellow fluid (colostrum) may leak from your breasts. This is the first milk you are producing for your baby.  False labor is a condition in which you feel small, irregular tightenings of the muscles in the womb (contractions) that eventually go away. These are called Braxton Hicks contractions. Contractions may last for hours, days, or even weeks before true labor sets in.  Signs of labor can include: abdominal cramps; regular contractions that start at 10 minutes apart and become stronger and more frequent with time; watery or bloody mucus discharge that comes from the vagina; increased pelvic pressure and dull back pain; and leaking of amniotic fluid. This information is not intended to replace advice given to you by your health  care provider. Make sure you discuss any questions you have with your health care provider. Document Released: 10/07/2001 Document Revised: 02/03/2019 Document Reviewed: 11/18/2016 Elsevier Patient Education  2020 [employer]  

## 2019-08-30 NOTE — Progress Notes (Signed)
ROB/US No concerns Denies lof, no vb Good FM 

## 2019-09-01 ENCOUNTER — Other Ambulatory Visit: Payer: Self-pay | Admitting: Maternal Newborn

## 2019-09-01 DIAGNOSIS — Z3689 Encounter for other specified antenatal screening: Secondary | ICD-10-CM

## 2019-09-02 ENCOUNTER — Telehealth: Payer: Self-pay | Admitting: Obstetrics & Gynecology

## 2019-09-02 NOTE — Telephone Encounter (Signed)
Pt is aware of [facility] perinatal appointment on 11/12@ 9:00a

## 2019-09-02 NOTE — Telephone Encounter (Signed)
Patient is returning call about ultrasound appointment. Please advise

## 2019-09-05 ENCOUNTER — Other Ambulatory Visit: Payer: Self-pay

## 2019-09-05 DIAGNOSIS — O0993 Supervision of high risk pregnancy, unspecified, third trimester: Secondary | ICD-10-CM

## 2019-09-06 ENCOUNTER — Other Ambulatory Visit: Payer: Self-pay

## 2019-09-06 ENCOUNTER — Other Ambulatory Visit (HOSPITAL_COMMUNITY)
Admission: RE | Admit: 2019-09-06 | Discharge: 2019-09-06 | Disposition: A | Discharge status: Home / Self Care | Payer: Medicaid Other | Source: Ambulatory Visit | Attending: Maternal Newborn | Admitting: Maternal Newborn

## 2019-09-06 ENCOUNTER — Ambulatory Visit (INDEPENDENT_AMBULATORY_CARE_PROVIDER_SITE_OTHER): Payer: Medicaid Other | Admitting: Maternal Newborn

## 2019-09-06 ENCOUNTER — Encounter: Payer: Self-pay | Admitting: Maternal Newborn

## 2019-09-06 VITALS — BP 120/76

## 2019-09-06 DIAGNOSIS — Z3A36 36 weeks gestation of pregnancy: Secondary | ICD-10-CM

## 2019-09-06 DIAGNOSIS — Z3483 Encounter for supervision of other normal pregnancy, third trimester: Secondary | ICD-10-CM

## 2019-09-06 NOTE — Progress Notes (Signed)
Gbs/aptima today. No vb. No lof 

## 2019-09-06 NOTE — Progress Notes (Signed)
    Routine Prenatal Care Visit  Subjective  Carmen Morales Maryjean Morn is a 23 y.o. [phone] at [redacted]w[redacted]d being seen today for ongoing prenatal care.  She is currently monitored for the following issues for this low-risk pregnancy and has Supervision of other normal pregnancy, antepartum and Asthma during pregnancy on their problem list.  ----------------------------------------------------------------------------------- Patient reports no complaints.   Contractions: Not present. Vag. Bleeding: None.  Movement: Present. No leaking of fluid.  ----------------------------------------------------------------------------------- The following portions of the patient's history were reviewed and updated as appropriate: allergies, current medications, past family history, past medical history, past social history, past surgical history and problem list. Problem list updated.   Objective  Blood pressure 120/76, last menstrual period 12/20/2018. Pregravid weight 186 lb (84.4 kg) Total Weight Gain 11 lb (4.99 kg)  Fetal Status: Fetal Heart Rate (bpm): 146 Fundal Height: 38 cm Movement: Present     General:  Alert, oriented and cooperative. Patient is in no acute distress.  Skin: Skin is warm and dry. No rash noted.   Cardiovascular: Normal heart rate noted  Respiratory: Normal respiratory effort, no problems with respiration noted  Abdomen: Soft, gravid, appropriate for gestational age. Pain/Pressure: Absent     Pelvic:  Cervical exam performed Dilation: Closed Effacement (%): 20 Station: -3, -2  Extremities: Normal range of motion.  Edema: None  Mental Status: Normal mood and affect. Normal behavior. Normal judgment and thought content.     Assessment   23 y.o. [id number] at [redacted]w[redacted]d EDD 10/04/2019, by Ultrasound presenting for a routine prenatal visit.  Plan   Pregnancy #4 Problems (from 12/20/18 to present)    Problem Noted Resolved   Supervision of other normal pregnancy, antepartum 03/22/2019 by Will Bonnet, MD No   Overview Addendum 07/22/2019  9:54 AM by Gae Dry, MD    [facility] Prenatal Labs  Dating 9 wk u/s Blood type: O/Positive/-- (05/05 1518)   Genetic Screen      NIPS: diploid XX Antibody:Negative (05/05 1518)  Anatomic [place] Normal anatomy scan Rubella: 2.32 (05/05 1518)  Varicella: immune  GTT   Third trimester:  RPR: Non Reactive (05/05 1518)   Rhogam na HBsAg: Negative (05/05 1518)   TDaP vaccine               Flu Shot: HIV: Non Reactive (05/05 1518)   Baby Food                                GBS:   Contraception  Pap:02/2019  CBB  no   CS/VBAC n/a   Support Person            Asthma during pregnancy 03/22/2019 by Will Bonnet, MD No   Overview Signed 03/22/2019 11:26 AM by Will Bonnet, MD    Rarely uses medicine. Continue to monitor.       GBS/Aptima today.   Preterm labor symptoms and general obstetric precautions including but not limited to vaginal bleeding, contractions, leaking of fluid and fetal movement were reviewed.  Please refer to After Visit Summary for other counseling recommendations.   Return in about 1 week (around 09/13/2019) for ROB.  Avel Sensor, CNM 09/06/2019  3:12 PM

## 2019-09-06 NOTE — Patient Instructions (Signed)
Third Trimester of Pregnancy The third trimester is from week 28 through week 40 (months 7 through 9). The third trimester is a time when the unborn baby (fetus) is growing rapidly. At the end of the ninth month, the fetus is about 20 inches in length and weighs 6-10 pounds. Body changes during your third trimester Your body will continue to go through many changes during pregnancy. The changes vary from woman to woman. During the third trimester:  Your weight will continue to increase. You can expect to gain 25-35 pounds (11-16 kg) by the end of the pregnancy.  You may begin to get stretch marks on your hips, abdomen, and breasts.  You may urinate more often because the fetus is moving lower into your pelvis and pressing on your bladder.  You may develop or continue to have heartburn. This is caused by increased hormones that slow down muscles in the digestive tract.  You may develop or continue to have constipation because increased hormones slow digestion and cause the muscles that push waste through your intestines to relax.  You may develop hemorrhoids. These are swollen veins (varicose veins) in the rectum that can itch or be painful.  You may develop swollen, bulging veins (varicose veins) in your legs.  You may have increased body aches in the pelvis, back, or thighs. This is due to weight gain and increased hormones that are relaxing your joints.  You may have changes in your hair. These can include thickening of your hair, rapid growth, and changes in texture. Some women also have hair loss during or after pregnancy, or hair that feels dry or thin. Your hair will most likely return to normal after your baby is born.  Your breasts will continue to grow and they will continue to become tender. A yellow fluid (colostrum) may leak from your breasts. This is the first milk you are producing for your baby.  Your belly button may stick out.  You may notice more swelling in your hands,  face, or ankles.  You may have increased tingling or numbness in your hands, arms, and legs. The skin on your belly may also feel numb.  You may feel short of breath because of your expanding uterus.  You may have more problems sleeping. This can be caused by the size of your belly, increased need to urinate, and an increase in your body's metabolism.  You may notice the fetus "dropping," or moving lower in your abdomen (lightening).  You may have increased vaginal discharge.  You may notice your joints feel loose and you may have pain around your pelvic bone. What to expect at prenatal visits You will have prenatal exams every 2 weeks until week 36. Then you will have weekly prenatal exams. During a routine prenatal visit:  You will be weighed to make sure you and the baby are growing normally.  Your blood pressure will be taken.  Your abdomen will be measured to track your baby's growth.  The fetal heartbeat will be listened to.  Any test results from the previous visit will be discussed.  You may have a cervical check near your due date to see if your cervix has softened or thinned (effaced).  You will be tested for Group B streptococcus. This happens between 35 and 37 weeks. Your health care provider may ask you:  What your birth plan is.  How you are feeling.  If you are feeling the baby move.  If you have had any abnormal   symptoms, such as leaking fluid, bleeding, severe headaches, or abdominal cramping.  If you are using any tobacco products, including cigarettes, chewing tobacco, and electronic cigarettes.  If you have any questions. Other tests or screenings that may be performed during your third trimester include:  Blood tests that check for low iron levels (anemia).  Fetal testing to check the health, activity level, and growth of the fetus. Testing is done if you have certain medical conditions or if there are problems during the pregnancy.  Nonstress test  (NST). This test checks the health of your baby to make sure there are no signs of problems, such as the baby not getting enough oxygen. During this test, a belt is placed around your belly. The baby is made to move, and its heart rate is monitored during movement. What is false labor? False labor is a condition in which you feel small, irregular tightenings of the muscles in the womb (contractions) that usually go away with rest, changing position, or drinking water. These are called Braxton Hicks contractions. Contractions may last for hours, days, or even weeks before true labor sets in. If contractions come at regular intervals, become more frequent, increase in intensity, or become painful, you should see your health care provider. What are the signs of labor?  Abdominal cramps.  Regular contractions that start at 10 minutes apart and become stronger and more frequent with time.  Contractions that start on the top of the uterus and spread down to the lower abdomen and back.  Increased pelvic pressure and dull back pain.  A watery or bloody mucus discharge that comes from the vagina.  Leaking of amniotic fluid. This is also known as your "water breaking." It could be a slow trickle or a gush. Let your health care provider know if it has a color or strange odor. If you have any of these signs, call your health care provider right away, even if it is before your due date. Follow these instructions at home: Medicines  Follow your health care provider's instructions regarding medicine use. Specific medicines may be either safe or unsafe to take during pregnancy.  Take a prenatal vitamin that contains at least 600 micrograms (mcg) of folic acid.  If you develop constipation, try taking a stool softener if your health care provider approves. Eating and drinking   Eat a balanced diet that includes fresh fruits and vegetables, whole grains, good sources of protein such as meat, eggs, or tofu,  and low-fat dairy. Your health care provider will help you determine the amount of weight gain that is right for you.  Avoid raw meat and uncooked cheese. These carry germs that can cause birth defects in the baby.  If you have low calcium intake from food, talk to your health care provider about whether you should take a daily calcium supplement.  Eat four or five small meals rather than three large meals a day.  Limit foods that are high in fat and processed sugars, such as fried and sweet foods.  To prevent constipation: ? Drink enough fluid to keep your urine clear or pale yellow. ? Eat foods that are high in fiber, such as fresh fruits and vegetables, whole grains, and beans. Activity  Exercise only as directed by your health care provider. Most women can continue their usual exercise routine during pregnancy. Try to exercise for 30 minutes at least 5 days a week. Stop exercising if you experience uterine contractions.  Avoid heavy lifting.  Do   not exercise in extreme heat or humidity, or at high altitudes.  Wear low-heel, comfortable shoes.  Practice good posture.  You may continue to have sex unless your health care provider tells you otherwise. Relieving pain and discomfort  Take frequent breaks and rest with your legs elevated if you have leg cramps or low back pain.  Take warm sitz baths to soothe any pain or discomfort caused by hemorrhoids. Use hemorrhoid cream if your health care provider approves.  Wear a good support bra to prevent discomfort from breast tenderness.  If you develop varicose veins: ? Wear support pantyhose or compression stockings as told by your healthcare provider. ? Elevate your feet for 15 minutes, 3-4 times a day. Prenatal care  Write down your questions. Take them to your prenatal visits.  Keep all your prenatal visits as told by your health care provider. This is important. Safety  Wear your seat belt at all times when driving.  Make  a list of emergency phone numbers, including numbers for family, friends, the hospital, and police and fire departments. General instructions  Avoid cat litter boxes and soil used by cats. These carry germs that can cause birth defects in the baby. If you have a cat, ask someone to clean the litter box for you.  Do not travel far distances unless it is absolutely necessary and only with the approval of your health care provider.  Do not use hot tubs, steam rooms, or saunas.  Do not drink alcohol.  Do not use any products that contain nicotine or tobacco, such as cigarettes and e-cigarettes. If you need help quitting, ask your health care provider.  Do not use any medicinal herbs or unprescribed drugs. These chemicals affect the formation and growth of the baby.  Do not douche or use tampons or scented sanitary pads.  Do not cross your legs for long periods of time.  To prepare for the arrival of your baby: ? Take prenatal classes to understand, practice, and ask questions about labor and delivery. ? Make a trial run to the hospital. ? Visit the hospital and tour the maternity area. ? Arrange for maternity or paternity leave through employers. ? Arrange for family and friends to take care of pets while you are in the hospital. ? Purchase a rear-facing car seat and make sure you know how to install it in your car. ? Pack your hospital bag. ? Prepare the baby's nursery. Make sure to remove all pillows and stuffed animals from the baby's crib to prevent suffocation.  Visit your dentist if you have not gone during your pregnancy. Use a soft toothbrush to brush your teeth and be gentle when you floss. Contact a health care provider if:  You are unsure if you are in labor or if your water has broken.  You become dizzy.  You have mild pelvic cramps, pelvic pressure, or nagging pain in your abdominal area.  You have lower back pain.  You have persistent nausea, vomiting, or diarrhea.   You have an unusual or bad smelling vaginal discharge.  You have pain when you urinate. Get help right away if:  Your water breaks before 37 weeks.  You have regular contractions less than 5 minutes apart before 37 weeks.  You have a fever.  You are leaking fluid from your vagina.  You have spotting or bleeding from your vagina.  You have severe abdominal pain or cramping.  You have rapid weight loss or weight gain.  You have   shortness of breath with chest pain.  You notice sudden or extreme swelling of your face, hands, ankles, feet, or legs.  Your baby makes fewer than 10 movements in 2 hours.  You have severe headaches that do not go away when you take medicine.  You have vision changes. Summary  The third trimester is from week 28 through week 40, months 7 through 9. The third trimester is a time when the unborn baby (fetus) is growing rapidly.  During the third trimester, your discomfort may increase as you and your baby continue to gain weight. You may have abdominal, leg, and back pain, sleeping problems, and an increased need to urinate.  During the third trimester your breasts will keep growing and they will continue to become tender. A yellow fluid (colostrum) may leak from your breasts. This is the first milk you are producing for your baby.  False labor is a condition in which you feel small, irregular tightenings of the muscles in the womb (contractions) that eventually go away. These are called Braxton Hicks contractions. Contractions may last for hours, days, or even weeks before true labor sets in.  Signs of labor can include: abdominal cramps; regular contractions that start at 10 minutes apart and become stronger and more frequent with time; watery or bloody mucus discharge that comes from the vagina; increased pelvic pressure and dull back pain; and leaking of amniotic fluid. This information is not intended to replace advice given to you by your health  care provider. Make sure you discuss any questions you have with your health care provider. Document Released: 10/07/2001 Document Revised: 02/03/2019 Document Reviewed: 11/18/2016 Elsevier Patient Education  2020 [employer]  

## 2019-09-08 ENCOUNTER — Ambulatory Visit (HOSPITAL_BASED_OUTPATIENT_CLINIC_OR_DEPARTMENT_OTHER)
Admission: RE | Admit: 2019-09-08 | Discharge: 2019-09-08 | Disposition: A | Discharge status: Home / Self Care | Payer: Medicaid Other | Source: Ambulatory Visit | Attending: Maternal Newborn | Admitting: Maternal Newborn

## 2019-09-08 ENCOUNTER — Other Ambulatory Visit: Payer: Self-pay

## 2019-09-08 ENCOUNTER — Ambulatory Visit
Admission: RE | Admit: 2019-09-08 | Discharge: 2019-09-08 | Disposition: A | Discharge status: Home / Self Care | Payer: Medicaid Other | Source: Ambulatory Visit | Attending: Obstetrics and Gynecology | Admitting: Obstetrics and Gynecology

## 2019-09-08 DIAGNOSIS — O3660X0 Maternal care for excessive fetal growth, unspecified trimester, not applicable or unspecified: Secondary | ICD-10-CM | POA: Insufficient documentation

## 2019-09-08 DIAGNOSIS — Z3689 Encounter for other specified antenatal screening: Secondary | ICD-10-CM | POA: Diagnosis present

## 2019-09-08 DIAGNOSIS — Z3A36 36 weeks gestation of pregnancy: Secondary | ICD-10-CM | POA: Insufficient documentation

## 2019-09-08 DIAGNOSIS — J45909 Unspecified asthma, uncomplicated: Secondary | ICD-10-CM | POA: Insufficient documentation

## 2019-09-08 DIAGNOSIS — O99519 Diseases of the respiratory system complicating pregnancy, unspecified trimester: Secondary | ICD-10-CM

## 2019-09-08 DIAGNOSIS — O3663X0 Maternal care for excessive fetal growth, third trimester, not applicable or unspecified: Secondary | ICD-10-CM | POA: Insufficient documentation

## 2019-09-08 DIAGNOSIS — Z3480 Encounter for supervision of other normal pregnancy, unspecified trimester: Secondary | ICD-10-CM | POA: Diagnosis not present

## 2019-09-08 DIAGNOSIS — O99513 Diseases of the respiratory system complicating pregnancy, third trimester: Secondary | ICD-10-CM | POA: Diagnosis not present

## 2019-09-08 DIAGNOSIS — O358XX0 Maternal care for other (suspected) fetal abnormality and damage, not applicable or unspecified: Secondary | ICD-10-CM | POA: Diagnosis not present

## 2019-09-08 DIAGNOSIS — O35EXX0 Maternal care for other (suspected) fetal abnormality and damage, fetal genitourinary anomalies, not applicable or unspecified: Secondary | ICD-10-CM

## 2019-09-08 LAB — STREP GP B NAA: Strep Gp B NAA: NEGATIVE

## 2019-09-08 LAB — CERVICOVAGINAL ANCILLARY ONLY
Chlamydia: NEGATIVE
Comment: NEGATIVE
Comment: NORMAL
Neisseria Gonorrhea: NEGATIVE

## 2019-09-08 NOTE — Progress Notes (Signed)
[facility] Consultation   Chief Complaint: [facility] >97th%ile and  fetal cyst   HPI: [name] is a 23 y.o. [phone] Married [place] speaking  female here with Carmen Morales at [redacted]w[redacted]d by [facility] scan 03/01/19 at 9 weeks  who presents in consultation from [facility]  for fetal cyst on 11/5 measured 6 x 4.9 cm simple cyst with single septum  - female fetus - not present at 24 weeks   9/25 -GCT 138 mg/dl  GBS neg Neg cell free   Past Medical History: Patient  has a past medical history of Asthma.  Past Surgical History: She  has no past surgical history on file.  Obstetric History:  OB History    Gravida  4   Para  3   Term  3   Preterm      AB      Living  3     SAB      TAB      Ectopic      Multiple      Live Births  3         SVD x 3 largest fetus is 9lbs 2 oz  Gynecologic History:  Patient's last menstrual period was 12/20/2018 (within days).  Astma - rare wheezing no inhaler in years   Medications: Current Outpatient Medications:  .  Prenatal Vit-Fe Fumarate-FA (MULTIVITAMIN-PRENATAL) 27-0.8 MG TABS tablet, Take 1 tablet by mouth daily at 12 noon., Disp: , Rfl:  Allergies: Patient has No Known Allergies.  Social History: Patient  reports that she has never smoked. She has never used smokeless tobacco. She reports that she does not drink alcohol or use drugs.  Family History: family history is not on file.  Review of Systems A full 12 point review of systems was negative or as noted in the History of Present Illness.  Physical Exam: 63 inches  198 lbs 108/74 Hr 88 RR20  Pulse ox 99 %    Asessement: 1. Supervision of other normal pregnancy, antepartum   2. Asthma during pregnancy   3. Fetal macrosomia during pregnancy in third trimester, single or unspecified fetus   4 Fetal pelvic cyst likely ovarian  cyst   Plan:  1. Fetal ovarian cyst - 4.9  x 5.2 x 3.6   cm - likely left ovarian cyst simple with septation -renal or GI origin  also  possible  Ok for SVD at [facility]  Neonate needs imaging  Referral to [facility] pediatric surgery made - Carmen Braun RN [phone] If ovarian - good chance of spontaneous resolution  Low risk of torsion or hemorrhage   2 LGA - pt has delivered a 9lb 2 oz fetus in the past - AC is 40 weeks size - fetal cyst should be distensible reasonable for SVD   3 asthma - no recent flare , no hemabate     Total time spent with the patient was 30 minutes with greater than 50% spent in counseling and coordination of care. We appreciate this interesting consult and will be happy to be involved in the ongoing care of [name] in anyway her obstetricians desire.  Carmen Morales, [facility]

## 2019-09-14 ENCOUNTER — Other Ambulatory Visit: Payer: Self-pay

## 2019-09-14 ENCOUNTER — Ambulatory Visit (INDEPENDENT_AMBULATORY_CARE_PROVIDER_SITE_OTHER): Payer: Medicaid Other | Admitting: Maternal Newborn

## 2019-09-14 ENCOUNTER — Encounter: Payer: Self-pay | Admitting: Maternal Newborn

## 2019-09-14 VITALS — BP 96/54 | Wt 201.0 lb

## 2019-09-14 DIAGNOSIS — Z3483 Encounter for supervision of other normal pregnancy, third trimester: Secondary | ICD-10-CM

## 2019-09-14 DIAGNOSIS — Z3A37 37 weeks gestation of pregnancy: Secondary | ICD-10-CM

## 2019-09-14 LAB — POCT URINALYSIS DIPSTICK OB
Glucose, UA: NEGATIVE
POC,PROTEIN,UA: NEGATIVE

## 2019-09-14 NOTE — Progress Notes (Signed)
Routine Prenatal Care Visit  Subjective  Carmen Morales is a 23 y.o. [phone] at [redacted]w[redacted]d being seen today for ongoing prenatal care.  She is currently monitored for the following issues for this low-risk pregnancy and has Supervision of other normal pregnancy, antepartum; Asthma during pregnancy; Fetal macrosomia during pregnancy; and Fetal abdominal cyst  on their problem list.  ----------------------------------------------------------------------------------- Patient reports fatigue.   Contractions: Not present. Vag. Bleeding: None.  Movement: Present. No leaking of fluid.  ----------------------------------------------------------------------------------- The following portions of the patient's history were reviewed and updated as appropriate: allergies, current medications, past family history, past medical history, past social history, past surgical history and problem list. Problem list updated.   Objective  Blood pressure (!) 96/54, weight 201 lb (91.2 kg), last menstrual period 12/20/2018. Pregravid weight 186 lb (84.4 kg) Total Weight Gain 15 lb (6.804 kg) Urinalysis: Urine dipstick shows negative for glucose, protein. Fetal Status: Fetal Heart Rate (bpm): 137 Fundal Height: 40 cm Movement: Present     General:  Alert, oriented and cooperative. Patient is in no acute distress.  Skin: Skin is warm and dry. No rash noted.   Cardiovascular: Normal heart rate noted  Respiratory: Normal respiratory effort, no problems with respiration noted  Abdomen: Soft, gravid, appropriate for gestational age. Pain/Pressure: Absent     Pelvic:  Cervical exam performed Dilation: Closed Effacement (%): 20 Station: -3  Extremities: Normal range of motion.  Edema: None  Mental Status: Normal mood and affect. Normal behavior. Normal judgment and thought content.     Assessment   23 y.o. [id number] at [redacted]w[redacted]d, EDD 10/04/2019 by Ultrasound presenting for a routine prenatal visit.  Plan   Pregnancy #4  Problems (from 12/20/18 to present)    Problem Noted Resolved   Supervision of other normal pregnancy, antepartum 03/22/2019 by Will Bonnet, MD No   Overview Addendum 07/22/2019  9:54 AM by Gae Dry, MD    [facility]inic [facility] Prenatal Labs  Dating 9 wk u/s Blood type: O/Positive/-- (05/05 1518)   Genetic Screen      NIPS: diploid XX Antibody:Negative (05/05 1518)  Anatomic [place] Normal anatomy scan Rubella: 2.32 (05/05 1518)  Varicella: immune  GTT   Third trimester:  RPR: Non Reactive (05/05 1518)   Rhogam na HBsAg: Negative (05/05 1518)   TDaP vaccine               Flu Shot: HIV: Non Reactive (05/05 1518)   Baby Food                                GBS:   Contraception  Pap:02/2019  CBB  no   CS/VBAC n/a   Support Person            Asthma during pregnancy 03/22/2019 by Will Bonnet, MD No   Overview Signed 03/22/2019 11:26 AM by Will Bonnet, MD    Rarely uses medicine. Continue to monitor.       MFM consult done last week for fetal pelvic cyst, likely ovarian. OK to proceed with delivery at [facility]. Will follow up at [facility] for imaging with newborn.  Term labor symptoms and general obstetric precautions including but not limited to vaginal bleeding, contractions, leaking of fluid and fetal movement were reviewed.  Please refer to After Visit Summary for other counseling recommendations.   Return in about 1 week (around 09/21/2019) for ROB.  Avel Sensor, CNM 09/14/2019  11:59 AM

## 2019-09-14 NOTE — Patient Instructions (Signed)
Third Trimester of Pregnancy The third trimester is from week 28 through week 40 (months 7 through 9). The third trimester is a time when the unborn baby (fetus) is growing rapidly. At the end of the ninth month, the fetus is about 20 inches in length and weighs 6-10 pounds. Body changes during your third trimester Your body will continue to go through many changes during pregnancy. The changes vary from woman to woman. During the third trimester:  Your weight will continue to increase. You can expect to gain 25-35 pounds (11-16 kg) by the end of the pregnancy.  You may begin to get stretch marks on your hips, abdomen, and breasts.  You may urinate more often because the fetus is moving lower into your pelvis and pressing on your bladder.  You may develop or continue to have heartburn. This is caused by increased hormones that slow down muscles in the digestive tract.  You may develop or continue to have constipation because increased hormones slow digestion and cause the muscles that push waste through your intestines to relax.  You may develop hemorrhoids. These are swollen veins (varicose veins) in the rectum that can itch or be painful.  You may develop swollen, bulging veins (varicose veins) in your legs.  You may have increased body aches in the pelvis, back, or thighs. This is due to weight gain and increased hormones that are relaxing your joints.  You may have changes in your hair. These can include thickening of your hair, rapid growth, and changes in texture. Some women also have hair loss during or after pregnancy, or hair that feels dry or thin. Your hair will most likely return to normal after your baby is born.  Your breasts will continue to grow and they will continue to become tender. A yellow fluid (colostrum) may leak from your breasts. This is the first milk you are producing for your baby.  Your belly button may stick out.  You may notice more swelling in your hands,  face, or ankles.  You may have increased tingling or numbness in your hands, arms, and legs. The skin on your belly may also feel numb.  You may feel short of breath because of your expanding uterus.  You may have more problems sleeping. This can be caused by the size of your belly, increased need to urinate, and an increase in your body's metabolism.  You may notice the fetus "dropping," or moving lower in your abdomen (lightening).  You may have increased vaginal discharge.  You may notice your joints feel loose and you may have pain around your pelvic bone. What to expect at prenatal visits You will have prenatal exams every 2 weeks until week 36. Then you will have weekly prenatal exams. During a routine prenatal visit:  You will be weighed to make sure you and the baby are growing normally.  Your blood pressure will be taken.  Your abdomen will be measured to track your baby's growth.  The fetal heartbeat will be listened to.  Any test results from the previous visit will be discussed.  You may have a cervical check near your due date to see if your cervix has softened or thinned (effaced).  You will be tested for Group B streptococcus. This happens between 35 and 37 weeks. Your health care provider may ask you:  What your birth plan is.  How you are feeling.  If you are feeling the baby move.  If you have had any abnormal   symptoms, such as leaking fluid, bleeding, severe headaches, or abdominal cramping.  If you are using any tobacco products, including cigarettes, chewing tobacco, and electronic cigarettes.  If you have any questions. Other tests or screenings that may be performed during your third trimester include:  Blood tests that check for low iron levels (anemia).  Fetal testing to check the health, activity level, and growth of the fetus. Testing is done if you have certain medical conditions or if there are problems during the pregnancy.  Nonstress test  (NST). This test checks the health of your baby to make sure there are no signs of problems, such as the baby not getting enough oxygen. During this test, a belt is placed around your belly. The baby is made to move, and its heart rate is monitored during movement. What is false labor? False labor is a condition in which you feel small, irregular tightenings of the muscles in the womb (contractions) that usually go away with rest, changing position, or drinking water. These are called Braxton Hicks contractions. Contractions may last for hours, days, or even weeks before true labor sets in. If contractions come at regular intervals, become more frequent, increase in intensity, or become painful, you should see your health care provider. What are the signs of labor?  Abdominal cramps.  Regular contractions that start at 10 minutes apart and become stronger and more frequent with time.  Contractions that start on the top of the uterus and spread down to the lower abdomen and back.  Increased pelvic pressure and dull back pain.  A watery or bloody mucus discharge that comes from the vagina.  Leaking of amniotic fluid. This is also known as your "water breaking." It could be a slow trickle or a gush. Let your health care provider know if it has a color or strange odor. If you have any of these signs, call your health care provider right away, even if it is before your due date. Follow these instructions at home: Medicines  Follow your health care provider's instructions regarding medicine use. Specific medicines may be either safe or unsafe to take during pregnancy.  Take a prenatal vitamin that contains at least 600 micrograms (mcg) of folic acid.  If you develop constipation, try taking a stool softener if your health care provider approves. Eating and drinking   Eat a balanced diet that includes fresh fruits and vegetables, whole grains, good sources of protein such as meat, eggs, or tofu,  and low-fat dairy. Your health care provider will help you determine the amount of weight gain that is right for you.  Avoid raw meat and uncooked cheese. These carry germs that can cause birth defects in the baby.  If you have low calcium intake from food, talk to your health care provider about whether you should take a daily calcium supplement.  Eat four or five small meals rather than three large meals a day.  Limit foods that are high in fat and processed sugars, such as fried and sweet foods.  To prevent constipation: ? Drink enough fluid to keep your urine clear or pale yellow. ? Eat foods that are high in fiber, such as fresh fruits and vegetables, whole grains, and beans. Activity  Exercise only as directed by your health care provider. Most women can continue their usual exercise routine during pregnancy. Try to exercise for 30 minutes at least 5 days a week. Stop exercising if you experience uterine contractions.  Avoid heavy lifting.  Do   not exercise in extreme heat or humidity, or at high altitudes.  Wear low-heel, comfortable shoes.  Practice good posture.  You may continue to have sex unless your health care provider tells you otherwise. Relieving pain and discomfort  Take frequent breaks and rest with your legs elevated if you have leg cramps or low back pain.  Take warm sitz baths to soothe any pain or discomfort caused by hemorrhoids. Use hemorrhoid cream if your health care provider approves.  Wear a good support bra to prevent discomfort from breast tenderness.  If you develop varicose veins: ? Wear support pantyhose or compression stockings as told by your healthcare provider. ? Elevate your feet for 15 minutes, 3-4 times a day. Prenatal care  Write down your questions. Take them to your prenatal visits.  Keep all your prenatal visits as told by your health care provider. This is important. Safety  Wear your seat belt at all times when driving.  Make  a list of emergency phone numbers, including numbers for family, friends, the hospital, and police and fire departments. General instructions  Avoid cat litter boxes and soil used by cats. These carry germs that can cause birth defects in the baby. If you have a cat, ask someone to clean the litter box for you.  Do not travel far distances unless it is absolutely necessary and only with the approval of your health care provider.  Do not use hot tubs, steam rooms, or saunas.  Do not drink alcohol.  Do not use any products that contain nicotine or tobacco, such as cigarettes and e-cigarettes. If you need help quitting, ask your health care provider.  Do not use any medicinal herbs or unprescribed drugs. These chemicals affect the formation and growth of the baby.  Do not douche or use tampons or scented sanitary pads.  Do not cross your legs for long periods of time.  To prepare for the arrival of your baby: ? Take prenatal classes to understand, practice, and ask questions about labor and delivery. ? Make a trial run to the hospital. ? Visit the hospital and tour the maternity area. ? Arrange for maternity or paternity leave through employers. ? Arrange for family and friends to take care of pets while you are in the hospital. ? Purchase a rear-facing car seat and make sure you know how to install it in your car. ? Pack your hospital bag. ? Prepare the baby's nursery. Make sure to remove all pillows and stuffed animals from the baby's crib to prevent suffocation.  Visit your dentist if you have not gone during your pregnancy. Use a soft toothbrush to brush your teeth and be gentle when you floss. Contact a health care provider if:  You are unsure if you are in labor or if your water has broken.  You become dizzy.  You have mild pelvic cramps, pelvic pressure, or nagging pain in your abdominal area.  You have lower back pain.  You have persistent nausea, vomiting, or diarrhea.   You have an unusual or bad smelling vaginal discharge.  You have pain when you urinate. Get help right away if:  Your water breaks before 37 weeks.  You have regular contractions less than 5 minutes apart before 37 weeks.  You have a fever.  You are leaking fluid from your vagina.  You have spotting or bleeding from your vagina.  You have severe abdominal pain or cramping.  You have rapid weight loss or weight gain.  You have   shortness of breath with chest pain.  You notice sudden or extreme swelling of your face, hands, ankles, feet, or legs.  Your baby makes fewer than 10 movements in 2 hours.  You have severe headaches that do not go away when you take medicine.  You have vision changes. Summary  The third trimester is from week 28 through week 40, months 7 through 9. The third trimester is a time when the unborn baby (fetus) is growing rapidly.  During the third trimester, your discomfort may increase as you and your baby continue to gain weight. You may have abdominal, leg, and back pain, sleeping problems, and an increased need to urinate.  During the third trimester your breasts will keep growing and they will continue to become tender. A yellow fluid (colostrum) may leak from your breasts. This is the first milk you are producing for your baby.  False labor is a condition in which you feel small, irregular tightenings of the muscles in the womb (contractions) that eventually go away. These are called Braxton Hicks contractions. Contractions may last for hours, days, or even weeks before true labor sets in.  Signs of labor can include: abdominal cramps; regular contractions that start at 10 minutes apart and become stronger and more frequent with time; watery or bloody mucus discharge that comes from the vagina; increased pelvic pressure and dull back pain; and leaking of amniotic fluid. This information is not intended to replace advice given to you by your health  care provider. Make sure you discuss any questions you have with your health care provider. Document Released: 10/07/2001 Document Revised: 02/03/2019 Document Reviewed: 11/18/2016 Elsevier Patient Education  2020 [employer]  

## 2019-09-20 ENCOUNTER — Other Ambulatory Visit: Payer: Self-pay

## 2019-09-20 ENCOUNTER — Encounter: Payer: Self-pay | Admitting: Obstetrics and Gynecology

## 2019-09-20 ENCOUNTER — Ambulatory Visit (INDEPENDENT_AMBULATORY_CARE_PROVIDER_SITE_OTHER): Payer: Medicaid Other | Admitting: Obstetrics and Gynecology

## 2019-09-20 DIAGNOSIS — Z3483 Encounter for supervision of other normal pregnancy, third trimester: Secondary | ICD-10-CM

## 2019-09-20 DIAGNOSIS — Z3A38 38 weeks gestation of pregnancy: Secondary | ICD-10-CM

## 2019-09-20 DIAGNOSIS — Z3480 Encounter for supervision of other normal pregnancy, unspecified trimester: Secondary | ICD-10-CM

## 2019-09-20 NOTE — Progress Notes (Signed)
ROB No concerns Denies lof, no vb, Good FM Desires cervical check/membrane sweep

## 2019-09-20 NOTE — Progress Notes (Signed)
    Routine Prenatal Care Visit  Subjective  Carmen Morales is a 23 y.o. [phone] at [redacted]w[redacted]d being seen today for ongoing prenatal care.  She is currently monitored for the following issues for this low-risk pregnancy and has Supervision of other normal pregnancy, antepartum; Asthma during pregnancy; Fetal macrosomia during pregnancy; and Fetal abdominal cyst  on their problem list.  ----------------------------------------------------------------------------------- Patient reports no complaints.   Contractions: Not present. Vag. Bleeding: None.  Movement: Present. Denies leaking of fluid.  ----------------------------------------------------------------------------------- The following portions of the patient's history were reviewed and updated as appropriate: allergies, current medications, past family history, past medical history, past social history, past surgical history and problem list. Problem list updated.   Objective  Blood pressure 100/64, weight 201 lb (91.2 kg), last menstrual period 12/20/2018. Pregravid weight 186 lb (84.4 kg) Total Weight Gain 15 lb (6.804 kg) Urinalysis:      Fetal Status: Fetal Heart Rate (bpm): 150 Fundal Height: 40 cm Movement: Present     General:  Alert, oriented and cooperative. Patient is in no acute distress.  Skin: Skin is warm and dry. No rash noted.   Cardiovascular: Normal heart rate noted  Respiratory: Normal respiratory effort, no problems with respiration noted  Abdomen: Soft, gravid, appropriate for gestational age. Pain/Pressure: Present     Pelvic:  Cervical exam performed Dilation: Closed Effacement (%): 20 Station: -3  Extremities: Normal range of motion.  Edema: None  Mental Status: Normal mood and affect. Normal behavior. Normal judgment and thought content.     Assessment   23 y.o. [id number] at [redacted]w[redacted]d by  10/04/2019, by Ultrasound presenting for routine prenatal visit  Plan   Pregnancy #4 Problems (from 12/20/18 to present)    Problem Noted Resolved   Supervision of other normal pregnancy, antepartum 03/22/2019 by Will Bonnet, MD No   Overview Addendum 09/20/2019 11:46 AM by Homero Fellers, MD    [facility]inic [facility] Prenatal Labs  Dating 9 wk u/s Blood type: O/Positive/-- (05/05 1518)   Genetic Screen      NIPS: diploid XX Antibody:Negative (05/05 1518)  Anatomic [place] Normal anatomy scan Rubella: 2.32 (05/05 1518)  Varicella: immune  GTT   Third trimester: 138 RPR: Non Reactive (05/05 1518)   Rhogam na HBsAg: Negative (05/05 1518)   TDaP vaccine  declines              Flu Shot:declines HIV: Non Reactive (05/05 1518)   Baby Food                                GBS: negative  Contraception  Pap:02/2019  CBB  no   CS/VBAC n/a   Support Person            Asthma during pregnancy 03/22/2019 by Will Bonnet, MD No   Overview Signed 03/22/2019 11:26 AM by Will Bonnet, MD    Rarely uses medicine. Continue to monitor.          Gestational age appropriate obstetric precautions including but not limited to vaginal bleeding, contractions, leaking of fluid and fetal movement were reviewed in detail with the patient.    Scheduled for IOL for next week. Discussed covid testing and L&D procedures.   No follow-ups on file.  Homero Fellers MD [facility] 09/20/2019, 11:57 AM

## 2019-09-20 NOTE — Patient Instructions (Addendum)
Induction:  09/27/2019 Covid testing 09/23/2019   Labor Induction  Labor induction is when steps are taken to cause a pregnant woman to begin the labor process. Most women go into labor on their own between 37 weeks and 42 weeks of pregnancy. When this does not happen or when there is a medical need for labor to begin, steps may be taken to induce labor. Labor induction causes a pregnant woman's uterus to contract. It also causes the cervix to soften (ripen), open (dilate), and thin out (efface). Usually, labor is not induced before 39 weeks of pregnancy unless there is a medical reason to do so. Your health care provider will determine if labor induction is needed. Before inducing labor, your health care provider will consider a number of factors, including:  Your medical condition and your baby's.  How many weeks along you are in your pregnancy.  How mature your baby's lungs are.  The condition of your cervix.  The position of your baby.  The size of your birth canal. What are some reasons for labor induction? Labor may be induced if:  Your health or your baby's health is at risk.  Your pregnancy is overdue by 1 week or more.  Your water breaks but labor does not start on its own.  There is a low amount of amniotic fluid around your baby. You may also choose (elect) to have labor induced at a certain time. Generally, elective labor induction is done no earlier than 39 weeks of pregnancy. What methods are used for labor induction? Methods used for labor induction include:  Prostaglandin medicine. This medicine starts contractions and causes the cervix to dilate and ripen. It can be taken by mouth (orally) or by being inserted into the vagina (suppository).  Inserting a small, thin tube (catheter) with a balloon into the vagina and then expanding the balloon with water to dilate the cervix.  Stripping the membranes. In this method, your health care provider gently separates  amniotic sac tissue from the cervix. This causes the cervix to stretch, which in turn causes the release of a hormone called progesterone. The hormone causes the uterus to contract. This procedure is often done during an office visit, after which you will be sent home to wait for contractions to begin.  Breaking the water. In this method, your health care provider uses a small instrument to make a small hole in the amniotic sac. This eventually causes the amniotic sac to break. Contractions should begin after a few hours.  Medicine to trigger or strengthen contractions. This medicine is given through an IV that is inserted into a vein in your arm. Except for membrane stripping, which can be done in a clinic, labor induction is done in the hospital so that you and your baby can be carefully monitored. How long does it take for labor to be induced? The length of time it takes to induce labor depends on how ready your body is for labor. Some inductions can take up to 2-3 days, while others may take less than a day. Induction may take longer if:  You are induced early in your pregnancy.  It is your first pregnancy.  Your cervix is not ready. What are some risks associated with labor induction? Some risks associated with labor induction include:  Changes in fetal heart rate, such as being too high, too low, or irregular (erratic).  Failed induction.  Infection in the mother or the baby.  Increased risk of having a cesarean  delivery.  Fetal death.  Breaking off (abruption) of the placenta from the uterus (rare).  Rupture of the uterus (very rare). When induction is needed for medical reasons, the benefits of induction generally outweigh the risks. What are some reasons for not inducing labor? Labor induction should not be done if:  Your baby does not tolerate contractions.  You have had previous surgeries on your uterus, such as a myomectomy, removal of fibroids, or a vertical scar from  a previous cesarean delivery.  Your placenta lies very low in your uterus and blocks the opening of the cervix (placenta previa).  Your baby is not in a head-down position.  The umbilical cord drops down into the birth canal in front of the baby.  There are unusual circumstances, such as the baby being very early (premature).  You have had more than 2 previous cesarean deliveries. Summary  Labor induction is when steps are taken to cause a pregnant woman to begin the labor process.  Labor induction causes a pregnant woman's uterus to contract. It also causes the cervix to ripen, dilate, and efface.  Labor is not induced before 39 weeks of pregnancy unless there is a medical reason to do so.  When induction is needed for medical reasons, the benefits of induction generally outweigh the risks. This information is not intended to replace advice given to you by your health care provider. Make sure you discuss any questions you have with your health care provider. Document Released: 03/04/2007 Document Revised: 10/16/2017 Document Reviewed: 11/26/2016 Elsevier Patient Education  2020 [employer].

## 2019-09-23 ENCOUNTER — Other Ambulatory Visit
Admission: RE | Admit: 2019-09-23 | Discharge: 2019-09-23 | Disposition: A | Discharge status: Home / Self Care | Payer: Medicaid Other | Source: Ambulatory Visit | Attending: Obstetrics and Gynecology | Admitting: Obstetrics and Gynecology

## 2019-09-23 DIAGNOSIS — Z20828 Contact with and (suspected) exposure to other viral communicable diseases: Secondary | ICD-10-CM | POA: Insufficient documentation

## 2019-09-23 DIAGNOSIS — Z01812 Encounter for preprocedural laboratory examination: Secondary | ICD-10-CM | POA: Insufficient documentation

## 2019-09-24 LAB — SARS CORONAVIRUS 2 (TAT 6-24 HRS): SARS Coronavirus 2: NEGATIVE

## 2019-09-26 ENCOUNTER — Telehealth: Payer: Self-pay

## 2019-09-26 NOTE — Telephone Encounter (Signed)
Patient is scheduled for induction tomorrow morning. Patient inquiring what time she needs to be NPO. [phone]

## 2019-09-26 NOTE — Telephone Encounter (Signed)
Spoke with pt, there is no need to be NPO for induction, per CRS she should be eating meals.

## 2019-09-28 ENCOUNTER — Inpatient Hospital Stay: Payer: Medicaid Other | Admitting: Anesthesiology

## 2019-09-28 ENCOUNTER — Inpatient Hospital Stay
Admission: RE | Admit: 2019-09-28 | Discharge: 2019-09-30 | DRG: 806 | Disposition: A | Discharge status: Home / Self Care | Payer: Medicaid Other | Attending: Certified Nurse Midwife | Admitting: Certified Nurse Midwife

## 2019-09-28 ENCOUNTER — Other Ambulatory Visit: Payer: Self-pay

## 2019-09-28 DIAGNOSIS — O358XX0 Maternal care for other (suspected) fetal abnormality and damage, not applicable or unspecified: Secondary | ICD-10-CM | POA: Diagnosis present

## 2019-09-28 DIAGNOSIS — Z3490 Encounter for supervision of normal pregnancy, unspecified, unspecified trimester: Secondary | ICD-10-CM | POA: Diagnosis present

## 2019-09-28 DIAGNOSIS — O9081 Anemia of the puerperium: Secondary | ICD-10-CM | POA: Diagnosis not present

## 2019-09-28 DIAGNOSIS — Z3480 Encounter for supervision of other normal pregnancy, unspecified trimester: Secondary | ICD-10-CM

## 2019-09-28 DIAGNOSIS — D62 Acute posthemorrhagic anemia: Secondary | ICD-10-CM | POA: Diagnosis not present

## 2019-09-28 DIAGNOSIS — Z3A39 39 weeks gestation of pregnancy: Secondary | ICD-10-CM

## 2019-09-28 DIAGNOSIS — O3663X0 Maternal care for excessive fetal growth, third trimester, not applicable or unspecified: Principal | ICD-10-CM | POA: Diagnosis present

## 2019-09-28 DIAGNOSIS — J45909 Unspecified asthma, uncomplicated: Secondary | ICD-10-CM

## 2019-09-28 LAB — CBC
HCT: 37.5 % (ref 36.0–46.0)
Hemoglobin: 12.2 g/dL (ref 12.0–15.0)
MCH: 27.1 pg (ref 26.0–34.0)
MCHC: 32.5 g/dL (ref 30.0–36.0)
MCV: 83.3 fL (ref 80.0–100.0)
Platelets: 144 10*3/uL — ABNORMAL LOW (ref 150–400)
RBC: 4.5 MIL/uL (ref 3.87–5.11)
RDW: 15.5 % (ref 11.5–15.5)
WBC: 7.3 10*3/uL (ref 4.0–10.5)
nRBC: 0 % (ref 0.0–0.2)

## 2019-09-28 LAB — TYPE AND SCREEN
ABO/RH(D): O POS
Antibody Screen: NEGATIVE

## 2019-09-28 MED ORDER — MISOPROSTOL 100 MCG PO TABS
25.0000 ug | ORAL_TABLET | ORAL | Status: DC | PRN
  Administered 2019-09-28 (×2): 25 ug via VAGINAL
  Filled 2019-09-28 (×4): qty 1

## 2019-09-28 MED ORDER — OXYTOCIN 10 UNIT/ML IJ SOLN
INTRAMUSCULAR | Status: AC
  Filled 2019-09-28: qty 2

## 2019-09-28 MED ORDER — AMMONIA AROMATIC IN INHA: 0.3000 mL | Freq: Once | RESPIRATORY_TRACT | Status: DC | PRN

## 2019-09-28 MED ORDER — BUTORPHANOL TARTRATE 1 MG/ML IJ SOLN
1.0000 mg | INTRAMUSCULAR | Status: DC | PRN
  Administered 2019-09-28: 1 mg via INTRAVENOUS
  Filled 2019-09-28: qty 1

## 2019-09-28 MED ORDER — OXYTOCIN 40 UNITS IN NORMAL SALINE INFUSION - SIMPLE MED
2.5000 [IU]/h | INTRAVENOUS | Status: DC
  Filled 2019-09-28: qty 1000

## 2019-09-28 MED ORDER — LACTATED RINGERS IV SOLN: 500.0000 mL | INTRAVENOUS | Status: DC | PRN

## 2019-09-28 MED ORDER — MISOPROSTOL 200 MCG PO TABS
ORAL_TABLET | ORAL | Status: AC
  Filled 2019-09-28: qty 4

## 2019-09-28 MED ORDER — EPHEDRINE 5 MG/ML INJ
10.0000 mg | INTRAVENOUS | Status: DC | PRN
  Filled 2019-09-28: qty 2

## 2019-09-28 MED ORDER — DIPHENHYDRAMINE HCL 50 MG/ML IJ SOLN: 12.5000 mg | INTRAMUSCULAR | Status: DC | PRN

## 2019-09-28 MED ORDER — LIDOCAINE HCL (PF) 1 % IJ SOLN: 30.0000 mL | INTRAMUSCULAR | Status: DC | PRN

## 2019-09-28 MED ORDER — LIDOCAINE HCL (PF) 1 % IJ SOLN
INTRAMUSCULAR | Status: AC
  Filled 2019-09-28: qty 30

## 2019-09-28 MED ORDER — TERBUTALINE SULFATE 1 MG/ML IJ SOLN: 0.2500 mg | Freq: Once | INTRAMUSCULAR | Status: DC | PRN

## 2019-09-28 MED ORDER — LIDOCAINE-EPINEPHRINE (PF) 1.5 %-1:200000 IJ SOLN
INTRAMUSCULAR | Status: DC | PRN
  Administered 2019-09-28: 3 mL via EPIDURAL

## 2019-09-28 MED ORDER — PHENYLEPHRINE 40 MCG/ML (10ML) SYRINGE FOR IV PUSH (FOR BLOOD PRESSURE SUPPORT)
80.0000 ug | PREFILLED_SYRINGE | INTRAVENOUS | Status: DC | PRN
  Filled 2019-09-28: qty 10

## 2019-09-28 MED ORDER — SODIUM CHLORIDE 0.9 % IV SOLN
INTRAVENOUS | Status: DC | PRN
  Administered 2019-09-28 (×2): 5 mL via EPIDURAL

## 2019-09-28 MED ORDER — LACTATED RINGERS IV SOLN
INTRAVENOUS | Status: DC
  Administered 2019-09-28 (×3): via INTRAVENOUS

## 2019-09-28 MED ORDER — MISOPROSTOL 200 MCG PO TABS: 800.0000 ug | ORAL_TABLET | Freq: Once | ORAL | Status: DC | PRN

## 2019-09-28 MED ORDER — OXYTOCIN BOLUS FROM INFUSION
500.0000 mL | Freq: Once | INTRAVENOUS | Status: AC
  Administered 2019-09-29: 500 mL via INTRAVENOUS

## 2019-09-28 MED ORDER — LIDOCAINE HCL (PF) 1 % IJ SOLN
INTRAMUSCULAR | Status: DC | PRN
  Administered 2019-09-28: 1 mL via INTRADERMAL

## 2019-09-28 MED ORDER — MISOPROSTOL 25 MCG QUARTER TABLET
ORAL_TABLET | ORAL | Status: AC
  Filled 2019-09-28: qty 1

## 2019-09-28 MED ORDER — LACTATED RINGERS IV SOLN: 500.0000 mL | Freq: Once | INTRAVENOUS | Status: DC

## 2019-09-28 MED ORDER — AMMONIA AROMATIC IN INHA
RESPIRATORY_TRACT | Status: AC
  Filled 2019-09-28: qty 10

## 2019-09-28 MED ORDER — ONDANSETRON HCL 4 MG/2ML IJ SOLN
4.0000 mg | Freq: Four times a day (QID) | INTRAMUSCULAR | Status: DC | PRN
  Administered 2019-09-29: 4 mg via INTRAVENOUS
  Filled 2019-09-28: qty 2

## 2019-09-28 MED ORDER — FENTANYL 2.5 MCG/ML W/ROPIVACAINE 0.15% IN NS 100 ML EPIDURAL (ARMC)
EPIDURAL | Status: AC
  Filled 2019-09-28: qty 100

## 2019-09-28 MED ORDER — FENTANYL 2.5 MCG/ML W/ROPIVACAINE 0.15% IN NS 100 ML EPIDURAL (ARMC)
12.0000 mL/h | EPIDURAL | Status: DC
  Administered 2019-09-28: 12 mL/h via EPIDURAL

## 2019-09-28 NOTE — Anesthesia Preprocedure Evaluation (Signed)
Anesthesia Evaluation  Patient identified by MRN, date of birth, ID band Patient awake    Reviewed: Allergy & Precautions, H&P , NPO status , Patient's Chart, lab work & pertinent test results  Airway Mallampati: III  TM Distance: >3 FB Neck ROM: full    Dental  (+) Chipped   Pulmonary asthma ,           Cardiovascular Exercise Tolerance: Good (-) hypertensionnegative cardio ROS       Neuro/Psych    GI/Hepatic negative GI ROS,   Endo/Other    Renal/GU   negative genitourinary   Musculoskeletal   Abdominal   Peds  Hematology negative hematology ROS (+)   Anesthesia Other Findings Past Medical History: No date: Asthma  Past Surgical History: No date: NO PAST SURGERIES  BMI    Body Mass Index: 35.43 kg/m      Reproductive/Obstetrics (+) Pregnancy                             Anesthesia Physical Anesthesia Plan  ASA: III  Anesthesia Plan: Epidural   Post-op Pain Management:    Induction:   PONV Risk Score and Plan:   Airway Management Planned:   Additional Equipment:   Intra-op Plan:   Post-operative Plan:   Informed Consent: I have reviewed the patients History and Physical, chart, labs and discussed the procedure including the risks, benefits and alternatives for the proposed anesthesia with the patient or authorized representative who has indicated his/her understanding and acceptance.       Plan Discussed with: Anesthesiologist  Anesthesia Plan Comments:         Anesthesia Quick Evaluation

## 2019-09-28 NOTE — Anesthesia Procedure Notes (Signed)
Epidural Patient location during procedure: OB Start time: 09/28/2019 8:39 PM End time: 09/28/2019 8:42 PM  Staffing Anesthesiologist: Carlas Vandyne, Precious Haws, MD Performed: anesthesiologist   Preanesthetic Checklist Completed: patient identified, site marked, surgical consent, pre-op evaluation, timeout performed, IV checked, risks and benefits discussed and monitors and equipment checked  Epidural Patient position: sitting Prep: ChloraPrep Patient monitoring: heart rate, continuous pulse ox and blood pressure Approach: midline Location: L3-L4 Injection technique: LOR saline  Needle:  Needle type: Tuohy  Needle gauge: 17 G Needle length: 9 cm and 9 Needle insertion depth: 7.5 cm Catheter type: closed end flexible Catheter size: 19 Gauge Catheter at skin depth: 12 cm Test dose: negative and 1.5% lidocaine with Epi 1:200 K  Assessment Sensory level: T10 Events: blood not aspirated, injection not painful, no injection resistance, negative IV test and no paresthesia  Additional Notes 1 attempt Pt. Evaluated and documentation done after procedure finished. Patient identified. Risks/Benefits/Options discussed with patient including but not limited to bleeding, infection, nerve damage, paralysis, failed block, incomplete pain control, headache, blood pressure changes, nausea, vomiting, reactions to medication both or allergic, itching and postpartum back pain. Confirmed with bedside nurse the patient's most recent platelet count. Confirmed with patient that they are not currently taking any anticoagulation, have any bleeding history or any family history of bleeding disorders. Patient expressed understanding and wished to proceed. All questions were answered. Sterile technique was used throughout the entire procedure. Please see nursing notes for vital signs. Test dose was given through epidural catheter and negative prior to continuing to dose epidural or start infusion. Warning signs of  high block given to the patient including shortness of breath, tingling/numbness in hands, complete motor block, or any concerning symptoms with instructions to call for help. Patient was given instructions on fall risk and not to get out of bed. All questions and concerns addressed with instructions to call with any issues or inadequate analgesia.   Patient tolerated the insertion well without immediate complications.Reason for block:procedure for pain

## 2019-09-28 NOTE — H&P (Signed)
OB History & Physical   History of Present Illness:  Chief Complaint:  Presents for induction of labor HPI:  Carmen Morales is a 23 y.o. [phone] female with EDC=10/04/2019 at [redacted]w[redacted]d dated by a 9 wk ultrasound.  Her pregnancy has been complicated by fetal macrosomia and a cyst in the fetal LLQ. The EFW on 11/24 was in the 96th% (7#14oz)  driven by an [facility] that was >97th%. The  LLQ cyst in the fetus appears to be a.5.2 x 4.9 x 3.6 cm simple with single septation. Maternal fetal Medicaine thinks most likely a ovarian cyst and recommend neonatal evaluation.  She presents to L&D for an elective IOL.   Prenatal care site: Prenatal care at [facility] has been remarkable for a 15# weight gain with the pregnancy and for the following:  [facility]inic [facility] Prenatal Labs  Dating 9 wk u/s Blood type: O/Positive/-- (05/05 1518)   Genetic Screen      NIPS: diploid XX Antibody:Negative (05/05 1518)  Anatomic [place] Normal anatomy scan Rubella: 2.32 (05/05 1518)  Varicella: immune  GTT   Third trimester: 138 RPR: Non Reactive (05/05 1518)   Rhogam na HBsAg: Negative (05/05 1518)   TDaP vaccine  declines              Flu Shot:declines HIV: Non Reactive (05/05 1518)   Baby Food       Breast and bottle                         GBS: negative  Contraception undecided Pap:02/2019  CBB  no   CS/VBAC n/a   Support Person     Past OB History: OB History  Gravida Para Term Preterm AB Living  4 3 3     3   SAB TAB Ectopic Multiple Live Births          3    # Outcome Date GA Lbr Len/2nd Weight Sex Delivery Anes PTL Lv  4 Current           3 Term 11/02/13 [redacted]w[redacted]d  3685 g F Vag-Spont   LIV  2 Term 01/16/11 [redacted]w[redacted]d  4139 g M Vag-Spont   LIV  1 Term 12/03/09 [redacted]w[redacted]d  3232 g F Vag-Spont   LIV       Maternal Medical History:   Past Medical History:  Diagnosis Date  . Asthma     Past Surgical History:  Procedure Laterality Date  . NO PAST SURGERIES      No Known Allergies  Prior to Admission medications    Medication Sig Start Date End Date Taking? Authorizing Provider  Prenatal Vit-Fe Fumarate-FA (MULTIVITAMIN-PRENATAL) 27-0.8 MG TABS tablet Take 1 tablet by mouth daily at 12 noon.   Yes [provider]          Social History: She  reports that she has never smoked. She has never used smokeless tobacco. She reports that she does not drink alcohol or use drugs.  Family History: family history includes Breast cancer in her mother.   Review of Systems: Negative x 10 systems reviewed except as noted in the HPI.      Physical Exam:  Vital Signs:   General: [name] female in no acute distress.  HEENT: normocephalic, atraumatic Heart: regular rate & rhythm.  No murmurs/rubs/gallops Lungs: clear to auscultation bilaterally Abdomen: soft, gravid, non-tender;  EFW: 8 1/2# Pelvic:   External: Normal external female genitalia  Cervix: 1/40%/-1/posterior/medium  Extremities: non-tender, symmetric,  trace edema bilaterally.  DTRs: +1  Neurologic: Alert & oriented x 3.   Baseline FHR: 135 baseline with accelerations to 150s, moderate variability Toco: mild, irregular contractions with varying duration   Assessment:  Carmen Morales is a 23 y.o. [phone] female at [redacted]w[redacted]d for an elective IOL FWB: Cat 1 tracing  Bishop score:5  Plan:  1. Admit to Labor & Delivery  2. CBC, T&S, Clrs, IVF 3. GBS negative.   4. Discussed risks of induction of labor including hyperstimulation, fetal intolerance, failed induction and Cesarean section. Explained methods of induction including Cytotec, AROM, Pitocin and foley bulb. SHe verbalizes understanding and wishes to proceed with induction. Cytotec 25 mcg placed vaginally. 5. O POS/ RI/ VI 6. Breast and bottle 7. Contraception: undecided  8. DId not receive TDAP or flu vaccine AP 9. Pain management: epidural when appropriate or IV analgesics, as desired  Farrel Conners  09/28/2019 9:27 AM

## 2019-09-28 NOTE — Progress Notes (Signed)
L&D Progress Note  Elective IOL-has received 2 doses of Cytotec 25 mcg PV. Last dose around 1330  S: Starting to feel contractions a little more.   O: BP 119/75 (BP Location: Left Arm)   Pulse 79   Temp 98.3 F (36.8 C)  Ht 5\' 3"  (1.6 m)   Wt 90.7 kg   LMP 12/20/2018 (Within Days)   BMI 35.43 kg/m    General: in NAD  FHR: 135 baseline with accelerations to 150s, moderate variability Toco: every 3 minutes  Cervix: 3/50%/-1 to -2 AROM inadvertently occurred during SVE  A: Progressing  P: Light regular diet for supper. Continue to monitor contractions-will add Pitocin if contractions space out after eating supper  Carmen Morales, CNM

## 2019-09-29 LAB — RPR: RPR Ser Ql: NONREACTIVE

## 2019-09-29 MED ORDER — DIBUCAINE (PERIANAL) 1 % EX OINT: 1.0000 "application " | TOPICAL_OINTMENT | CUTANEOUS | Status: DC | PRN

## 2019-09-29 MED ORDER — IBUPROFEN 600 MG PO TABS
600.0000 mg | ORAL_TABLET | Freq: Four times a day (QID) | ORAL | Status: DC
  Administered 2019-09-29 – 2019-09-30 (×5): 600 mg via ORAL
  Filled 2019-09-29 (×5): qty 1

## 2019-09-29 MED ORDER — ONDANSETRON HCL 4 MG PO TABS: 4.0000 mg | ORAL_TABLET | ORAL | Status: DC | PRN

## 2019-09-29 MED ORDER — ACETAMINOPHEN 325 MG PO TABS: 650.0000 mg | ORAL_TABLET | ORAL | Status: DC | PRN

## 2019-09-29 MED ORDER — SENNOSIDES-DOCUSATE SODIUM 8.6-50 MG PO TABS
2.0000 | ORAL_TABLET | ORAL | Status: DC
  Filled 2019-09-29: qty 2

## 2019-09-29 MED ORDER — PRENATAL MULTIVITAMIN CH
1.0000 | ORAL_TABLET | Freq: Every day | ORAL | Status: DC
  Administered 2019-09-29 – 2019-09-30 (×2): 1 via ORAL
  Filled 2019-09-29 (×2): qty 1

## 2019-09-29 MED ORDER — BENZOCAINE-MENTHOL 20-0.5 % EX AERO: 1.0000 "application " | INHALATION_SPRAY | CUTANEOUS | Status: DC | PRN

## 2019-09-29 MED ORDER — WITCH HAZEL-GLYCERIN EX PADS: 1.0000 "application " | MEDICATED_PAD | CUTANEOUS | Status: DC | PRN

## 2019-09-29 MED ORDER — COCONUT OIL OIL: 1.0000 "application " | TOPICAL_OIL | Status: DC | PRN

## 2019-09-29 MED ORDER — SIMETHICONE 80 MG PO CHEW: 80.0000 mg | CHEWABLE_TABLET | ORAL | Status: DC | PRN

## 2019-09-29 MED ORDER — ONDANSETRON HCL 4 MG/2ML IJ SOLN: 4.0000 mg | INTRAMUSCULAR | Status: DC | PRN

## 2019-09-29 MED ORDER — OXYCODONE HCL 5 MG PO TABS: 5.0000 mg | ORAL_TABLET | ORAL | Status: DC | PRN

## 2019-09-29 NOTE — Lactation Note (Signed)
This note was copied from a baby's chart. Lactation Consultation Note  Patient Name: Carmen Morales [id number] Date: 09/29/2019 Reason for consult: Initial assessment   LC intern walked in the room and introduced herself. Mom was resting in bed, dad was sleep in the chair, and baby was sleeping in the crib. This is baby number four and mom has breastfeeding experience. [facility] intern asked how the breastfeeding experience was going so far. Mom reported things were going well and she had no questions or concern at this time.   [facility] intern reviewed breastfeeding basics with mom and first day of life expectations. [facility] intern reminded mom to look for early feeding cues. Hand expression demonstration was offered but mom declined. [facility] intern offered to return later to demonstrate or answer any questions or concerns at that time. Mom was encouraged to call out for assistance if needed.   Maternal Data Formula Feeding for Exclusion: No Has patient been taught Hand Expression?: No(Offered patient declined at this time.) Does the patient have breastfeeding experience prior to this delivery?: Yes  Feeding    LATCH Score                   Interventions Interventions: Breast feeding basics reviewed  Lactation Tools Discussed/Used     Consult Status Consult Status: Follow-up Date: 09/29/19 Follow-up type: In-patient    Lavonia Drafts 09/29/2019, 9:51 AM

## 2019-09-29 NOTE — Progress Notes (Signed)
   Subjective:  Patient is doing well on postpartum day 0, 6 hours. She is tolerating regular diet. Her pain is controlled with PO medications. She is ambulating and voiding without difficulty. She reports breastfeeding is going well.   Objective:  Vital signs in last 24 hours: Temp:  [98.1 F (36.7 C)-98.7 F (37.1 C)] 98.7 F (37.1 C) (12/03 0725) Pulse Rate:  [73-108] 74 (12/03 0725) Resp:  [18] 18 (12/03 0725) BP: (95-121)/(53-85) 103/65 (12/03 0725) SpO2:  [97 %-99 %] 98 % (12/03 0725)    General: NAD Pulmonary: no increased work of breathing Abdomen: non-distended, non-tender, fundus firm at level of umbilicus Extremities: no edema, no erythema, no tenderness  Results for orders placed or performed during the hospital encounter of 09/28/19 (from the past 72 hour(s))  Type and screen [place]     Status: None   Collection Time: 09/28/19  9:05 AM  Result Value Ref Range   ABO/RH(D) O POS    Antibody Screen NEG    Sample Expiration      10/01/2019,2359 Performed at [facility], [address] [place], [place] [address]   CBC     Status: Abnormal   Collection Time: 09/28/19  9:07 AM  Result Value Ref Range   WBC 7.3 4.0 - 10.5 K/uL   RBC 4.50 3.87 - 5.11 MIL/uL   Hemoglobin 12.2 12.0 - 15.0 g/dL   HCT 37.5 36.0 - 46.0 %   MCV 83.3 80.0 - 100.0 fL   MCH 27.1 26.0 - 34.0 pg   MCHC 32.5 30.0 - 36.0 g/dL   RDW 15.5 11.5 - 15.5 %   Platelets 144 (L) 150 - 400 K/uL   nRBC 0.0 0.0 - 0.2 %    Comment: Performed at [facility], [address], [place], [place] [address]    Assessment:   23 y.o. [phone] postpartum day # 0, lactating  Plan:    1) Acute blood loss anemia - hemodynamically stable and asymptomatic - po ferrous sulfate  2) Blood Type --/--/O POS (12/02 6979) / Rubella 2.32 (05/05 1518) / Varicella Immune  3) TDAP status declines  4) Feeding plan breast  5)  Education given regarding options for  contraception, as well as compatibility with breast feeding if applicable.  Patient is undecided about which method she will use  6) Disposition: continue current care   Rod Can, [facility] 09/29/2019, 9:15 AM

## 2019-09-29 NOTE — Discharge Instructions (Signed)
°Vaginal Delivery, Care After °Refer to this sheet in the next few weeks. These discharge instructions provide you with information on caring for yourself after delivery. Your caregiver may also give you specific instructions. Your treatment has been planned according to the most current medical practices available, but problems sometimes occur. Call your caregiver if you have any problems or questions after you go home. °HOME CARE INSTRUCTIONS °1. Take over-the-counter or prescription medicines only as directed by your caregiver or pharmacist. °2. Do not drink alcohol, especially if you are breastfeeding or taking medicine to relieve pain. °3. Do not smoke tobacco. °4. Continue to use good perineal care. Good perineal care includes: °1. Wiping your perineum from back to front °2. Keeping your perineum clean. °3. You can do sitz baths twice a day, to help keep this area clean °5. Do not use tampons, douche or have sex for 6 weeks °6. Shower only and avoid sitting in submerged water, aside from sitz baths °7. Wear a well-fitting bra that provides breast support. °8. Eat healthy foods. °9. Drink enough fluids to keep your urine clear or pale yellow. °10. Eat high-fiber foods such as whole grain cereals and breads, brown rice, beans, and fresh fruits and vegetables every day. These foods may help prevent or relieve constipation. °11. Avoid constipation with high fiber foods or medications, such as miralax or metamucil °12. Follow your caregiver's recommendations regarding resumption of activities such as climbing stairs, driving, lifting, exercising, or traveling. °13. Talk to your caregiver about resuming sexual activities. Resumption of sexual activities after 6 weeks is dependent upon your risk of infection, your rate of healing, and your comfort and desire to resume sexual activity. °14. Try to have someone help you with your household activities and your newborn for at least a few days after you leave the  hospital. °15. Rest as much as possible. Try to rest or take a nap when your newborn is sleeping. °16. Increase your activities gradually. °17. Keep all of your scheduled postpartum appointments. It is very important to keep your scheduled follow-up appointments. At these appointments, your caregiver will be checking to make sure that you are healing physically and emotionally. °SEEK MEDICAL CARE IF:  °· You are passing large clots from your vagina. Save any clots to show your caregiver. °· You have a foul smelling discharge from your vagina. °· You have trouble urinating. °· You are urinating frequently. °· You have pain when you urinate. °· You have a change in your bowel movements. °· You have increasing redness, pain, or swelling near your vaginal incision (episiotomy) or vaginal tear. °· You have pus draining from your episiotomy or vaginal tear. °· Your episiotomy or vaginal tear is separating. °· You have painful, hard, or reddened breasts. °· You have a severe headache. °· You have blurred vision or see spots. °· You feel sad or depressed. °· You have thoughts of hurting yourself or your newborn. °· You have questions about your care, the care of your newborn, or medicines. °· You are dizzy or light-headed. °· You have a rash. °· You have nausea or vomiting. °· You were breastfeeding and have not had a menstrual period within 12 weeks after you stopped breastfeeding. °· You are not breastfeeding and have not had a menstrual period by the 12th week after delivery. °· You have a fever of 100.5 or more °SEEK IMMEDIATE MEDICAL CARE IF:  °· You have persistent pain. °· You have chest pain. °· You have shortness   of breath. °· You faint. °· You have leg pain. °· You have stomach pain. °· Your vaginal bleeding saturates two or more sanitary pads in 1 hour. °MAKE SURE YOU:  °· Understand these instructions. °· Will watch your condition. °· Will get help right away if you are not doing well or get worse. °Document  Released: 10/10/2000 Document Revised: 02/27/2014 Document Reviewed: 06/09/2012 °ExitCare® Patient Information ©2015 [employer]. This information is not intended to replace advice given to you by your health care provider. Make sure you discuss any questions you have with your health care provider. ° °Sitz Bath °A sitz bath is a warm water bath taken in the sitting position. The water covers only the hips and butt (buttocks). We recommend using one that fits in the toilet, to help with ease of use and cleanliness. It may be used for either healing or cleaning purposes. Sitz baths are also used to relieve pain, itching, or muscle tightening (spasms). The water may contain medicine. Moist heat will help you heal and relax.  °HOME CARE  °Take 3 to 4 sitz baths a day. °18. Fill the bathtub half-full with warm water. °19. Sit in the water and open the drain a little. °20. Turn on the warm water to keep the tub half-full. Keep the water running constantly. °21. Soak in the water for 15 to 20 minutes. °22. After the sitz bath, pat the affected area dry. °GET HELP RIGHT AWAY IF: °You get worse instead of better. Stop the sitz baths if you get worse. °MAKE SURE YOU: °· Understand these instructions. °· Will watch your condition. °· Will get help right away if you are not doing well or get worse. °Document Released: 11/20/2004 Document Revised: 07/07/2012 Document Reviewed: 02/10/2011 °ExitCare® Patient Information ©2015 [employer]. This information is not intended to replace advice given to you by your health care provider. Make sure you discuss any questions you have with your health care provider. ° ° °

## 2019-09-29 NOTE — Discharge Summary (Signed)
Physician Obstetric Discharge Summary  Patient ID: Carmen Morales MRN: [id number] DOB/AGE: December 21, 1995 23 y.o.   Date of Admission: 09/28/2019 Date of Delivery: 09/29/2019 Delivering Provider: Dalia Heading, CNM Date of Discharge: 09/30/2019  Admitting Diagnosis: Induction of labor at [redacted]w[redacted]d  Secondary Diagnosis: care of lactating mom  Mode of Delivery: normal spontaneous vaginal delivery 09/29/2019      Discharge Diagnosis: Term intrauterine pregnancy-delivered, Persistent occiput posterior   Intrapartum Procedures: epidural and Cytotec induction   Post partum procedures: none  Complications: none   Carmen Morales is a [id number] who had a SVD on 09/29/2019;  for further details of this delivery, please refer to the delivery note.  Patient had an uncomplicated postpartum course.  By time of discharge on PPD#1, her pain was controlled on oral pain medications; she had appropriate lochia and was ambulating, voiding without difficulty and tolerating regular diet.  She was deemed stable for discharge to home.  She initiated breastfeeding and was having some trouble with latch on left breast. She gave baby some formula but says she will now continue with exclusive breastfeeding. She verbalizes understanding of newborn pediatric needs with regards to ovarian cyst.   Labs: CBC Latest Ref Rng & Units 09/30/2019 09/28/2019 07/22/2019  WBC 4.0 - 10.5 K/uL 10.0 7.3 7.0  Hemoglobin 12.0 - 15.0 g/dL 11.0(L) 12.2 11.5  Hematocrit 36.0 - 46.0 % 33.7(L) 37.5 35.8  Platelets 150 - 400 K/uL 157 144(L) 163   O POS/ RI/ VI  Physical exam:  Blood pressure 97/66, pulse (!) 58, temperature 98.3 F (36.8 C), temperature source Oral, resp. rate 20, height 5\' 3"  (1.6 m), weight 90.7 kg, last menstrual period 12/20/2018, SpO2 98 %, currently breastfeeding. General: alert and no distress Lochia: appropriate Abdomen: soft, NT Uterine Fundus: firm Incision: NA Extremities: No evidence  of DVT seen on physical exam. No lower extremity edema.  Discharge Instructions: Per After Visit Summary. Activity: Advance as tolerated. Pelvic rest for 6 weeks.  Also refer to Discharge Instructions Diet: Regular Medications: Allergies as of 09/30/2019   No Known Allergies     Medication List    TAKE these medications   multivitamin-prenatal 27-0.8 MG Tabs tablet Take 1 tablet by mouth daily at 12 noon.      Outpatient follow up:  Follow-up Information    Dalia Heading, CNM. Schedule an appointment as soon as possible for a visit.   Specialty: Certified Nurse Midwife Why: for a 6 week postpartum check up Contact information: [place] [address] [phone]          Postpartum contraception: considering Depo  Discharged Condition: good  Discharged to: home   Newborn Data: Carmen Morales Disposition:home with mother  Apgars: APGAR (1 MIN): 9   APGAR (5 MINS): 9   APGAR (10 MINS):    Baby Feeding: Bottle and Breast  Rod Can, CNM 09/30/2019 9:05 AM

## 2019-09-30 LAB — CBC
HCT: 33.7 % — ABNORMAL LOW (ref 36.0–46.0)
Hemoglobin: 11 g/dL — ABNORMAL LOW (ref 12.0–15.0)
MCH: 27.7 pg (ref 26.0–34.0)
MCHC: 32.6 g/dL (ref 30.0–36.0)
MCV: 84.9 fL (ref 80.0–100.0)
Platelets: 157 10*3/uL (ref 150–400)
RBC: 3.97 MIL/uL (ref 3.87–5.11)
RDW: 15.6 % — ABNORMAL HIGH (ref 11.5–15.5)
WBC: 10 10*3/uL (ref 4.0–10.5)
nRBC: 0 % (ref 0.0–0.2)

## 2019-09-30 NOTE — Progress Notes (Signed)
Pt discharged with infant.  Discharge instructions, prescriptions and follow up appointment given to and reviewed with pt. Pt verbalized understanding. Escorted out by staff. 

## 2019-09-30 NOTE — Anesthesia Postprocedure Evaluation (Signed)
Anesthesia Post Note  Patient: Carmen Morales  Procedure(s) Performed: AN AD HOC LABOR EPIDURAL  Patient location during evaluation: Mother Baby Anesthesia Type: Epidural Level of consciousness: awake and alert Pain management: pain level controlled Vital Signs Assessment: post-procedure vital signs reviewed and stable Respiratory status: spontaneous breathing, nonlabored ventilation and respiratory function stable Cardiovascular status: stable Postop Assessment: no headache, no backache and epidural receding Anesthetic complications: no     Last Vitals:  Vitals:   09/29/19 1159 09/29/19 2357  BP: 95/62 102/62  Pulse: 68 74  Resp: 20 18  Temp: 36.7 C 36.6 C  SpO2: 99% 99%    Last Pain:  Vitals:   09/29/19 2357  TempSrc: Oral  PainSc: 0-No pain                 Deserea Bordley B Clarisa Kindred

## 2020-07-06 IMAGING — US US MFM OB COMP +14 WKS
1 series · 12 of 28 positions shown · non-contrast
Comparison: none

PATIENT INFO:

PERFORMED BY:
                   Sonographer                              ERXLEBEN
SERVICE(S) PROVIDED:
 ----------------------------------------------------------------------
INDICATIONS:
  36 weeks gestation of pregnancy
FETAL EVALUATION:
 Num Of Fetuses:         1
 Fetal Heart Rate(bpm):  149
 Cardiac Activity:       Present
 Presentation:           Cephalic
 Placenta:               Anterior
 AFI Sum(cm)     %Tile       Largest Pocket(cm)
 9.15            16
 RUQ(cm)       RLQ(cm)       LUQ(cm)        LLQ(cm)
 0
BIOMETRY:
 BPD:      90.1  mm     G. Age:  36w 4d         66  %    CI:        78.52   %    70 - 86
                                                         FL/HC:      21.8   %    20.1 -
 HC:      321.6  mm     G. Age:  36w 2d         22  %    HC/AC:      0.87        0.93 -
 AC:      368.1  mm     G. Age:  40w 5d       > 97  %    FL/BPD:     77.8   %    71 - 87
 FL:       70.1  mm     G. Age:  36w 0d         38  %    FL/AC:      19.0   %    20 - 24
 HUM:      62.3  mm     G. Age:  36w 1d         61  %
 Est. FW:    5499  gm    7 lb 14 oz      96  %
GESTATIONAL AGE:
 LMP:           37w 3d        Date:  12/20/18                 EDD:   09/26/19
 U/S Today:     37w 3d                                        EDD:   09/26/19
 Best:          36w 2d     Det. By:  Early Ultrasound         EDD:   10/04/19
                                     (03/01/19)
ANATOMY:
 Cavum:                 CSP visualized         Ductal Arch:            Normal appearance
 Ventricles:            Normal appearance      Diaphragm:              Within Normal Limits
 Cerebellum:            Within Normal Limits   Stomach:                Seen
 Posterior Fossa:       Within Normal Limits   Abdomen:                Within Normal
                                                                       Limits
 Nuchal Fold:           Beyond 22 weeks        Abdominal Wall:         Normal appearance
                        gestation
 Face:                  Orbits visualized      Cord Vessels:           3 vessels
 Lips:                  Normal appearance      Kidneys:                Normal appearance
 Thoracic:              Within Normal Limits   Bladder:                Seen
 Heart:                 4-Chamber view         Spine:                  Unable to evaluate
                        appears normal                                 due to position
 RVOT:                  Normal appearance      Upper Extremities:      Visualized
 LVOT:                  Normal appearance      Lower Extremities:      Visualized
 Aortic Arch:           Normal appearance
 Other:  LLQ cyst seen.5.2 x 4.9 x 3.6 cm simple with single septation
CERVIX UTERUS ADNEXA:
 Cervix
 Suboptimal
 Adnexa
 WNL

[Series 1: us mfm ob comp +14 wks · 0.22mm/px · 67 acquisitions, 12 frames shown]
[im 3/67]
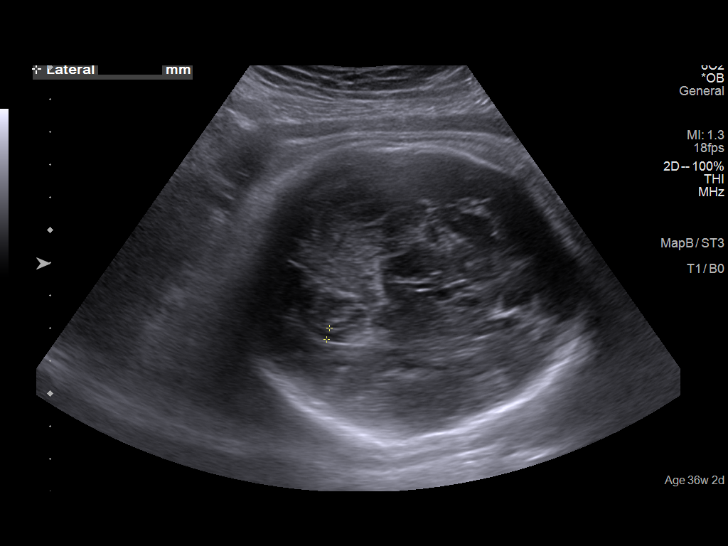
[im 8/67]
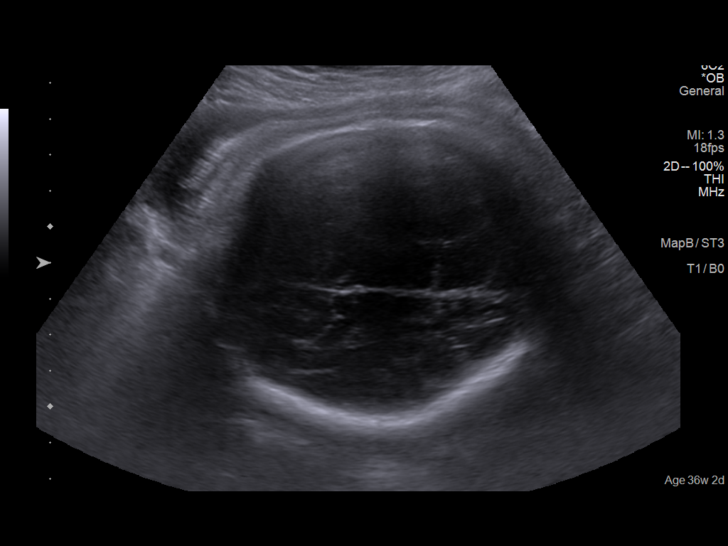
[im 13/67]
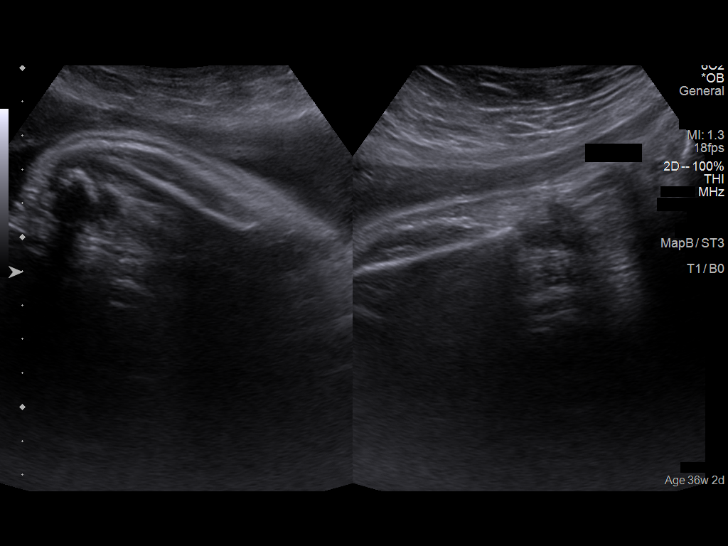
[im 20/67]
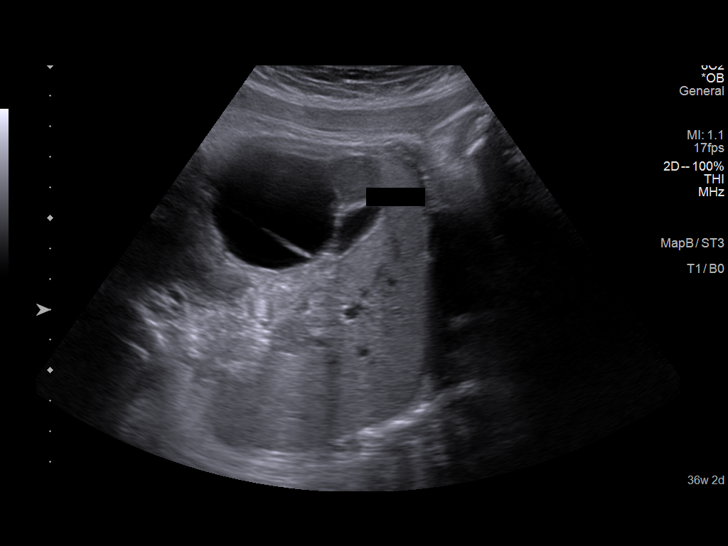
[im 25/67]
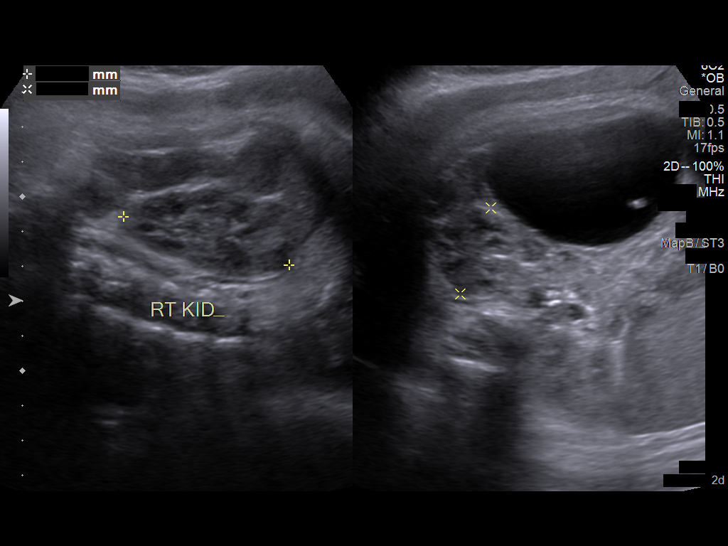
[im 30/67]
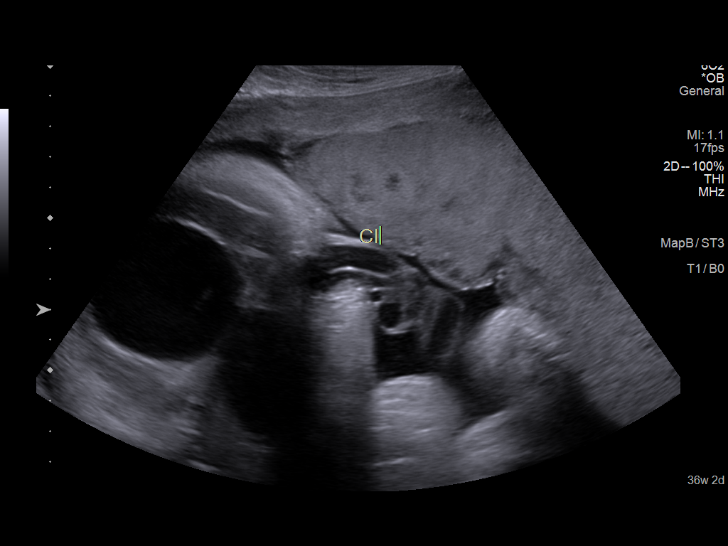
[im 37/67]
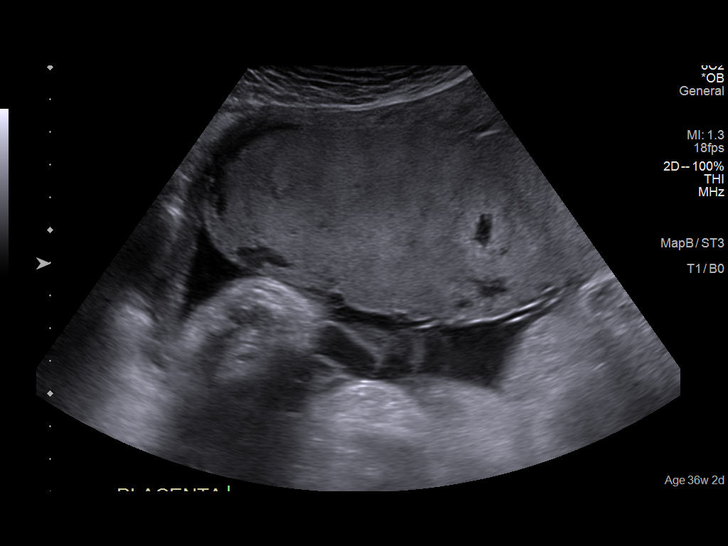
[im 42/67]
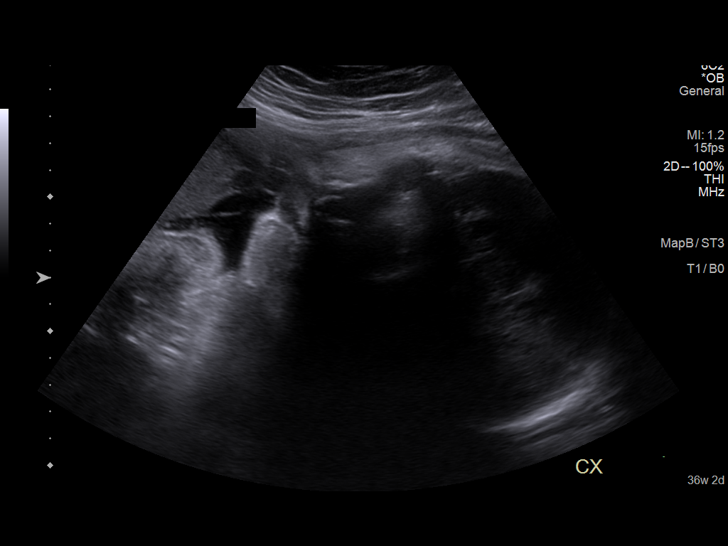
[im 47/67]
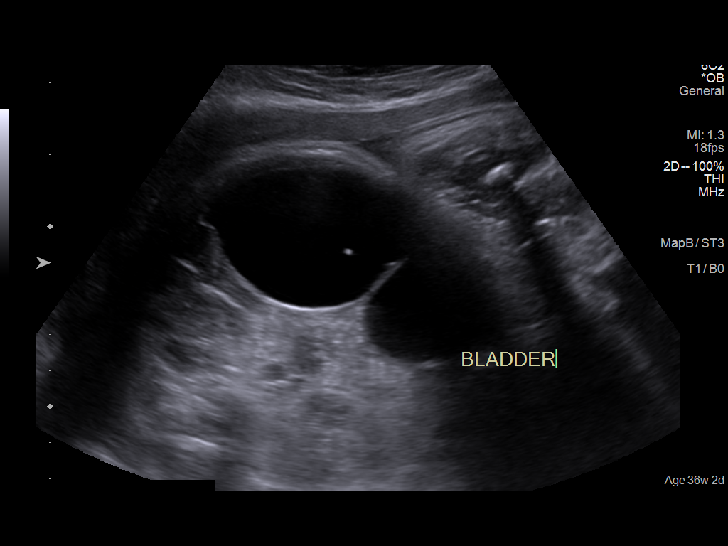
[im 54/67]
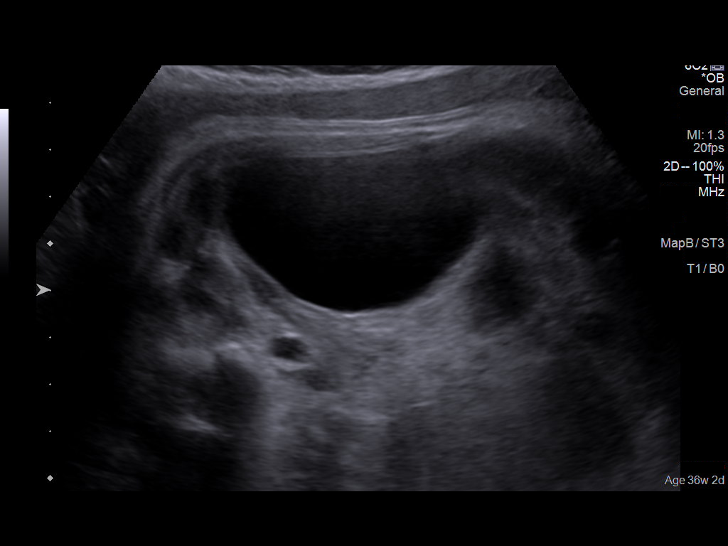
[im 59/67]
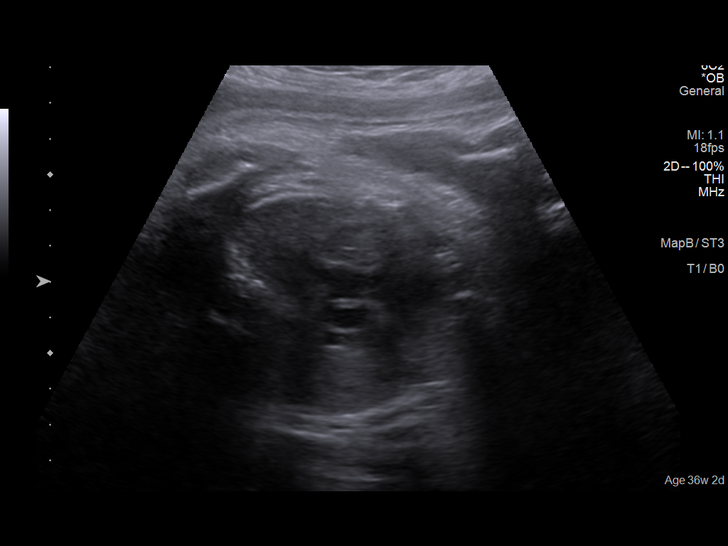
[im 64/67]
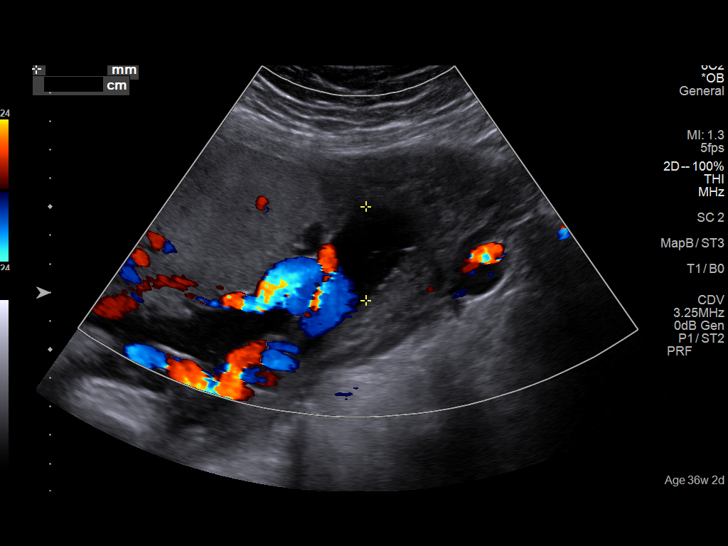

[12 of 28 positions shown; findings below may reference images not displayed]

IMPRESSION: Dear Ms.   ERXLEBEN,

 Thank you for referring your patient  for a fetal anatomical
 survey due to presence of a fetal abdominal cyst and
 increased AC .

 There is a singleton gestation with subjectively normal
 amniotic fluid volume.

 Patient is at 36w 2d based on earliest scan done at [facility]
 at 03/01/19 at 9w 0d
 EFW is at the 96th percentile driven primarily by the AC
 which is 4 weeks ahead

 Detailed evaluation of the fetal anatomy was performed.
 There is a left sided fetal pelvic cyst measuring  LLQ cyst
 seen.5.2 x 4.9 x 3.6 cm simple with single septation. The cyst
 appears independent of the kidney bladder and GI tract.
 Given it is a female fetus - the most likley diagnosis is fetal
 left ovarian cyst , though Other fetal GI and renal cysts would
 be included int he differential
 .The fetal anatomical survey appears within normal limits
 within the resolution of ultrasound as described above.

 See consult -
 Fetal ovarian cysts <4cm have a high rate of spontaneous
 resolution - this cyst is slightly larger .
 There is a risk of hemorrhage into the cyst and of torsion.

 Recommend imaging as a neonate and follow up wiht a
 pediatric surgeon .
 I have made a referral to Lit Korn at [facility]
 who will assist the patietn in coordinating follow up .

 Vaginal delviery at [HOSPITAL]  is reasonable fetal  large AC is 40
 weeks size and should be distensible .

 Thank you for allowing us to participate in your patient's care.

 assistance

## 2021-10-27 NOTE — L&D Delivery Note (Signed)
Delivery Summary for Carmen Morales  Labor Events:   Preterm labor: No data found  Rupture date: No data found  Rupture time: No data found  Rupture type: No data found  Fluid Color: No data found  Induction: No data found  Augmentation: No data found  Complications: No data found  Cervical ripening: No data found No data found   No data found     Delivery:   Episiotomy: No data found  Lacerations: No data found  Repair suture: No data found  Repair # of packets: No data found  Blood loss (ml): No data found   Information for the patient's newborn:  Oralee, [place] [[id number]]   Delivery 07/01/2022 12:54 PM by  C-Section, Low Transverse Sex:  female Gestational Age: [redacted]w[redacted]d Delivery Clinician:   Living?:         APGARS  One minute Five minutes Ten minutes  Skin color:        Heart rate:        Grimace:        Muscle tone:        Breathing:        Totals: 8  9      Presentation/position:      Resuscitation:   Cord information:    Disposition of cord blood:     Blood gases sent?  Complications:   Placenta: Delivered:       appearance Newborn Measurements: Weight: 7 lb 13.6 oz (3560 g)  Height: 19.5"  Head circumference:    Chest circumference:    Other providers:    Additional  information: Forceps:   Vacuum:   Breech:   Observed anomalies       See Dr. Oretha Milch operative note for details of procedure.    Hildred Laser, MD [facility]

## 2022-01-01 ENCOUNTER — Encounter: Payer: Self-pay | Admitting: Obstetrics and Gynecology

## 2022-01-13 ENCOUNTER — Other Ambulatory Visit: Payer: Self-pay

## 2022-01-13 ENCOUNTER — Ambulatory Visit (INDEPENDENT_AMBULATORY_CARE_PROVIDER_SITE_OTHER): Payer: Medicaid Other | Admitting: Obstetrics and Gynecology

## 2022-01-13 ENCOUNTER — Encounter: Payer: Self-pay | Admitting: Obstetrics and Gynecology

## 2022-01-13 VITALS — BP 121/85 | HR 96 | Ht 62.0 in | Wt 203.0 lb

## 2022-01-13 DIAGNOSIS — Z3200 Encounter for pregnancy test, result unknown: Secondary | ICD-10-CM

## 2022-01-13 DIAGNOSIS — Z641 Problems related to multiparity: Secondary | ICD-10-CM | POA: Diagnosis not present

## 2022-01-13 LAB — POCT URINE PREGNANCY: Preg Test, Ur: POSITIVE — AB

## 2022-01-13 NOTE — Progress Notes (Signed)
HPI: ?     [name] Carmen Morales is a 26 y.o. [id number] who LMP was Patient's last menstrual period was 09/16/2021 (approximate). ? ?Subjective:  ? ?She presents today for pregnancy confirmation.  She states that she had a home positive pregnancy test in February but this was after 1 brief episode of bleeding.  She is unsure of her last menstrual period. ?She has had 4 previous vaginal births without complications. ?She was not preventing but not attempting pregnancy. ?She is currently taking prenatal vitamins. ? ?  Hx: ?The following portions of the patient's history were reviewed and updated as appropriate: ?            She  has a past medical history of Asthma. ?She does not have any pertinent problems on file. ?She  has a past surgical history that includes No past surgeries; Wisdom tooth extraction (2014); and Tonsilectomy/adenoidectomy with myringotomy (2004). ?Her family history includes Breast cancer in her mother. ?She  reports that she has never smoked. She has never used smokeless tobacco. She reports that she does not drink alcohol and does not use drugs. ?She has a current medication list which includes the following prescription(s): multivitamin-prenatal. ?She has No Known Allergies. ?      ?Review of Systems:  ?Review of Systems ? ?Constitutional: Denied constitutional symptoms, night sweats, recent illness, fatigue, fever, insomnia and weight loss.  ?Eyes: Denied eye symptoms, eye pain, photophobia, vision change and visual disturbance.  ?Ears/Nose/Throat/Neck: Denied ear, nose, throat or neck symptoms, hearing loss, nasal discharge, sinus congestion and sore throat.  ?Cardiovascular: Denied cardiovascular symptoms, arrhythmia, chest pain/pressure, edema, exercise intolerance, orthopnea and palpitations.  ?Respiratory: Denied pulmonary symptoms, asthma, pleuritic pain, productive sputum, cough, dyspnea and wheezing.  ?Gastrointestinal: Denied, gastro-esophageal reflux, melena, nausea and vomiting.   ?Genitourinary: Denied genitourinary symptoms including symptomatic vaginal discharge, pelvic relaxation issues, and urinary complaints.  ?Musculoskeletal: Denied musculoskeletal symptoms, stiffness, swelling, muscle weakness and myalgia.  ?Dermatologic: Denied dermatology symptoms, rash and scar.  ?Neurologic: Denied neurology symptoms, dizziness, headache, neck pain and syncope.  ?Psychiatric: Denied psychiatric symptoms, anxiety and depression.  ?Endocrine: Denied endocrine symptoms including hot flashes and night sweats.  ? ?Meds: ?  ?Current Outpatient Medications on File Prior to Visit  ?Medication Sig Dispense Refill  ? Prenatal Vit-Fe Fumarate-FA (MULTIVITAMIN-PRENATAL) 27-0.8 MG TABS tablet Take 1 tablet by mouth daily at 12 noon.    ? ?No current facility-administered medications on file prior to visit.  ? ? ? ? ?Objective:  ?  ? ?Vitals:  ? 01/13/22 1058  ?BP: 121/85  ?Pulse: 96  ? ?Filed Weights  ? 01/13/22 1058  ?Weight: 203 lb (92.1 kg)  ? ?  ?         Urinary pregnancy test positive ?        ? ?Assessment:  ?  ?[id number] ?Patient Active Problem List  ? Diagnosis Date Noted  ? Encounter for care or examination of lactating mother 09/30/2019  ? Normal vaginal delivery 09/29/2019  ? Postpartum care following vaginal delivery 09/29/2019  ? Encounter for elective induction of labor 09/28/2019  ? Fetal macrosomia during pregnancy 09/08/2019  ? Fetal abdominal cyst  09/08/2019  ? Supervision of other normal pregnancy, antepartum 03/22/2019  ? Asthma during pregnancy 03/22/2019  ? ?  ?1. Possible pregnancy, not yet confirmed   ?2. Grand multipara   ? ? Dates unknown. ? ? ?Plan:  ?  ?       ? Prenatal Plan ?1.  The patient  was given prenatal literature. ?2.  She was continued on prenatal vitamins. ?3.  A prenatal lab panel to be drawn at nurse visit. ?4.  An ultrasound was ordered to better determine an EDC. ?5.  A nurse visit was scheduled. ?6.  Genetic testing and testing for other inheritable conditions  discussed in detail. She will decide in the future whether to have these labs performed. ?7.  A general overview of pregnancy testing, visit schedule, ultrasound schedule, and prenatal care was discussed. ?8.  COVID and its risks associated with pregnancy, prevention by limiting exposure and use of masks, as well as the risks and benefits of vaccination during pregnancy were discussed in detail.  [facility] policy regarding office and hospital visitation and testing was explained. ?9.  Benefits of breast-feeding discussed in detail including both maternal and infant benefits. Ready Set Baby website discussed. ? ?Orders ?Orders Placed This Encounter  ?Procedures  ? US OB Comp Less 14 Wks  ? POCT urine pregnancy  ? ? No orders of the defined types were placed in this encounter. ?  ?  F/U ? Return in about 4 weeks (around 02/10/2022). ?I spent 31 minutes involved in the care of this patient preparing to see the patient by obtaining and reviewing her medical history (including labs, imaging tests and prior procedures), documenting clinical information in the electronic health record (EHR), counseling and coordinating care plans, writing and sending prescriptions, ordering tests or procedures and in direct communicating with the patient and medical staff discussing pertinent items from her history and physical exam. ? ?Elonda Husky, M.D. ?01/13/2022 ?11:44 AM ? ? ? ?

## 2022-01-17 ENCOUNTER — Other Ambulatory Visit: Payer: Self-pay

## 2022-01-17 ENCOUNTER — Ambulatory Visit (INDEPENDENT_AMBULATORY_CARE_PROVIDER_SITE_OTHER): Payer: Medicaid Other

## 2022-01-17 DIAGNOSIS — Z3482 Encounter for supervision of other normal pregnancy, second trimester: Secondary | ICD-10-CM | POA: Diagnosis not present

## 2022-01-17 DIAGNOSIS — Z3A15 15 weeks gestation of pregnancy: Secondary | ICD-10-CM

## 2022-01-17 DIAGNOSIS — Z3200 Encounter for pregnancy test, result unknown: Secondary | ICD-10-CM

## 2022-01-24 ENCOUNTER — Ambulatory Visit (INDEPENDENT_AMBULATORY_CARE_PROVIDER_SITE_OTHER): Payer: Medicaid Other | Admitting: Obstetrics and Gynecology

## 2022-01-24 VITALS — BP 105/66 | HR 66 | Wt 198.9 lb

## 2022-01-24 DIAGNOSIS — Z3482 Encounter for supervision of other normal pregnancy, second trimester: Secondary | ICD-10-CM

## 2022-01-24 DIAGNOSIS — O99210 Obesity complicating pregnancy, unspecified trimester: Secondary | ICD-10-CM

## 2022-01-24 DIAGNOSIS — Z113 Encounter for screening for infections with a predominantly sexual mode of transmission: Secondary | ICD-10-CM

## 2022-01-24 DIAGNOSIS — Z1379 Encounter for other screening for genetic and chromosomal anomalies: Secondary | ICD-10-CM

## 2022-01-24 DIAGNOSIS — Z3A18 18 weeks gestation of pregnancy: Secondary | ICD-10-CM | POA: Diagnosis not present

## 2022-01-24 NOTE — Progress Notes (Signed)
Carmen Morales presents for NOB nurse intake visit. Pregnancy confirmation done at [facility] by Dr. Linzie Collin on 01/13/22.  G.5  P.4004  LMP. 09/16/21 EDD. [redacted]w[redacted]d [place]. Pregnancy education material explained and given. 0 cats in the home.  NOB labs ordered. BMI greater than 30. TSH/HbgA1c ordered. Sickle cell not ordered due to race. HIV and drug screen explained and ordered. Genetic screening discussed. Genetic testing; Ordered. Pt to discuss genetic testing with provider. PNV encouraged. Pt to follow up with provider in 1-2 weeks for NOB physical.  FMLA,[facility] Financial Policy and HIV/Drug all reviewed and signed by patient.  ?Patient requested Dr. Valentino Saxon for NOB Physical.  ?

## 2022-01-26 LAB — URINE CULTURE, OB REFLEX

## 2022-01-26 LAB — GC/CHLAMYDIA PROBE AMP
Chlamydia trachomatis, NAA: NEGATIVE
Neisseria Gonorrhoeae by PCR: NEGATIVE

## 2022-01-26 LAB — CULTURE, OB URINE

## 2022-01-28 LAB — ABO AND RH: Rh Factor: POSITIVE

## 2022-01-28 LAB — AFP, SERUM, OPEN SPINA BIFIDA
AFP MoM: 0.49
AFP Value: 20 ng/mL
Gest. Age on Collection Date: 18.6 weeks
Maternal Age At EDD: 28.5 yr
OSBR Risk 1 IN: 10000
Test Results:: NEGATIVE
Weight: 198 [lb_av]

## 2022-01-28 LAB — VIRAL HEPATITIS HBV, HCV
HCV Ab: NONREACTIVE
Hep B Core Total Ab: NEGATIVE
Hep B Surface Ab, Qual: REACTIVE
Hepatitis B Surface Ag: NEGATIVE

## 2022-01-28 LAB — RPR: RPR Ser Ql: NONREACTIVE

## 2022-01-28 LAB — HCV INTERPRETATION

## 2022-01-28 LAB — VARICELLA ZOSTER ANTIBODY, IGG: Varicella zoster IgG: 1709 index (ref >165)

## 2022-01-28 LAB — RUBELLA SCREEN: Rubella Antibodies, IGG: 1.7 index (ref >0.99)

## 2022-01-28 LAB — HIV ANTIBODY (ROUTINE TESTING W REFLEX): HIV Screen 4th Generation wRfx: NONREACTIVE

## 2022-01-28 LAB — ANTIBODY SCREEN: Antibody Screen: NEGATIVE

## 2022-01-30 LAB — PAIN MGT SCRN (14 DRUGS), UR
Amphetamine Scrn, Ur: NEGATIVE ng/mL
BARBITURATE SCREEN URINE: NEGATIVE ng/mL
BENZODIAZEPINE SCREEN, URINE: NEGATIVE ng/mL
Buprenorphine, Urine: NEGATIVE ng/mL
CANNABINOIDS UR QL SCN: NEGATIVE ng/mL
Cocaine (Metab) Scrn, Ur: NEGATIVE ng/mL
Creatinine(Crt), U: 178.1 mg/dL (ref 20.0–300.0)
Fentanyl, Urine: NEGATIVE pg/mL
Meperidine Screen, Urine: NEGATIVE ng/mL
Methadone Screen, Urine: NEGATIVE ng/mL
OXYCODONE+OXYMORPHONE UR QL SCN: NEGATIVE ng/mL
Opiate Scrn, Ur: NEGATIVE ng/mL
Ph of Urine: 6.9 (ref 4.5–8.9)
Phencyclidine Qn, Ur: NEGATIVE ng/mL
Propoxyphene Scrn, Ur: NEGATIVE ng/mL
Tramadol Screen, Urine: NEGATIVE ng/mL

## 2022-01-30 LAB — MATERNIT21  PLUS CORE+ESS+SCA, BLOOD

## 2022-01-30 LAB — URINALYSIS, ROUTINE W REFLEX MICROSCOPIC

## 2022-01-30 LAB — NICOTINE SCREEN, URINE: Cotinine Ql Scrn, Ur: NEGATIVE ng/mL

## 2022-02-11 ENCOUNTER — Encounter: Payer: Medicaid Other | Admitting: Obstetrics and Gynecology

## 2022-02-18 ENCOUNTER — Ambulatory Visit (INDEPENDENT_AMBULATORY_CARE_PROVIDER_SITE_OTHER): Payer: Medicaid Other | Admitting: Obstetrics and Gynecology

## 2022-02-18 ENCOUNTER — Encounter: Payer: Self-pay | Admitting: Obstetrics and Gynecology

## 2022-02-18 VITALS — BP 98/53 | HR 85 | Ht 62.0 in | Wt 198.4 lb

## 2022-02-18 DIAGNOSIS — Z3A22 22 weeks gestation of pregnancy: Secondary | ICD-10-CM | POA: Diagnosis not present

## 2022-02-18 DIAGNOSIS — Z3482 Encounter for supervision of other normal pregnancy, second trimester: Secondary | ICD-10-CM | POA: Diagnosis not present

## 2022-02-18 DIAGNOSIS — Z124 Encounter for screening for malignant neoplasm of cervix: Secondary | ICD-10-CM

## 2022-02-18 DIAGNOSIS — O09299 Supervision of pregnancy with other poor reproductive or obstetric history, unspecified trimester: Secondary | ICD-10-CM | POA: Insufficient documentation

## 2022-02-18 DIAGNOSIS — Z8709 Personal history of other diseases of the respiratory system: Secondary | ICD-10-CM | POA: Insufficient documentation

## 2022-02-18 DIAGNOSIS — O43102 Malformation of placenta, unspecified, second trimester: Secondary | ICD-10-CM

## 2022-02-18 LAB — POCT URINALYSIS DIPSTICK OB
Bilirubin, UA: NEGATIVE
Blood, UA: NEGATIVE
Glucose, UA: NEGATIVE
Ketones, UA: NEGATIVE
Nitrite, UA: NEGATIVE
Spec Grav, UA: 1.02 (ref 1.010–1.025)
Urobilinogen, UA: 0.2 E.U./dL
pH, UA: 6.5 (ref 5.0–8.0)

## 2022-02-18 NOTE — Progress Notes (Signed)
? ?OBSTETRIC INITIAL PRENATAL VISIT ? ?Subjective:  ? ? Carmen Morales is being seen today for her first obstetrical visit.  This is not a planned pregnancy. She is a 26 y.o. [id number] female at [redacted]w[redacted]d gestation, Estimated Date of Delivery: 06/23/22 with Patient's last menstrual period was 09/16/2021 (approximate).,  dated with 15 week sono. Her obstetrical history is significant for  history of asthma currently on no medications. . Patient does intend to breast feed. Pregnancy history fully reviewed. ? ? ?Of note, dating ultrasound at 15 weeks noted possible placenta previa.  Patient has questions regarding what this means for her and her pregnancy. ? ? ?OB History  ?Gravida Para Term Preterm AB Living  ?5 4 4  0 0 4  ?SAB IAB Ectopic Multiple Live Births  ?0 0 0 0 4  ?  ?# Outcome Date GA Lbr Len/2nd Weight Sex Delivery Anes PTL Lv  ?5 Current           ?4 Term 09/29/19 [redacted]w[redacted]d 07:23 / 01:48 8 lb 7.1 oz (3.83 kg) F Vag-Spont EPI  LIV  ?   Name: ALAS CRUZ,GIRL Shamiyah  ?   Apgar1: 9  Apgar5: 9  ?3 Term 11/02/13 [redacted]w[redacted]d  8 lb 2 oz (3.685 kg) F Vag-Spont   LIV  ?   Name: Threasa Alpha   ?2 Term 01/16/11 [redacted]w[redacted]d  9 lb 2 oz (4.139 kg) M Vag-Spont   LIV  ?   Name: Elita Quick  ?1 Term 12/03/09 [redacted]w[redacted]d  7 lb 2 oz (3.232 kg) F Vag-Spont   LIV  ?   Name: Anderson Malta   ? ? ?Gynecologic History:  ?Last pap smear was ~ 2 years ago per patient.  Results were Normal.  ?Denies h/o abnormal pap smears in the past.  ?Denies history of STIs.  ?Contraception prior to conception:  ? ? ?Past Medical History:  ?Diagnosis Date  ? Asthma   ? ? ?Family History  ?Problem Relation Age of Onset  ? Breast cancer Mother   ?     37s  ? Hypertension Neg Hx   ? Diabetes Neg Hx   ? Heart attack Neg Hx   ? Stroke Neg Hx   ? ? ?Past Surgical History:  ?Procedure Laterality Date  ? NO PAST SURGERIES    ? TONSILECTOMY/ADENOIDECTOMY WITH MYRINGOTOMY  2004  ? Tonsilectomy  ? [place] EXTRACTION  2014  ? ? ?Social History  ? ?Socioeconomic History  ? Marital status: Significant  Other  ?  Spouse name: Elita Quick  ? Number of children: Not on file  ? Years of education: Not on file  ? Highest education level: Not on file  ?Occupational History  ? Not on file  ?Tobacco Use  ? Smoking status: Never  ? Smokeless tobacco: Never  ?Vaping Use  ? Vaping Use: Never used  ?Substance and Sexual Activity  ? Alcohol use: Never  ? Drug use: Never  ? Sexual activity: Yes  ?  Birth control/protection: None  ?Other Topics Concern  ? Not on file  ?Social History Narrative  ? Not on file  ? ?Social Determinants of Health  ? ?Financial Resource Strain: Not on file  ?Food Insecurity: Not on file  ?Transportation Needs: Not on file  ?Physical Activity: Not on file  ?Stress: Not on file  ?Social Connections: Not on file  ?Intimate Partner Violence: Not on file  ? ? ?Current Outpatient Medications on File Prior to Visit  ?Medication Sig Dispense Refill  ? Prenatal Vit-Fe Fumarate-FA (  MULTIVITAMIN-PRENATAL) 27-0.8 MG TABS tablet Take 1 tablet by mouth daily at 12 noon.    ? ?No current facility-administered medications on file prior to visit.  ? ? ?No Known Allergies ? ? ?Review of Systems ?General: Not Present- Fever, Weight Loss and Weight Gain. ?Skin: Not Present- Rash. ?HEENT: Not Present- Blurred Vision, Headache and Bleeding Gums. ?Respiratory: Not Present- Difficulty Breathing. ?Breast: Not Present- Breast Mass. ?Cardiovascular: Not Present- Chest Pain, Elevated Blood Pressure, Fainting / Blacking Out and Shortness of Breath. ?Gastrointestinal: Not Present- Abdominal Pain, Constipation, Nausea and Vomiting. ?Female Genitourinary: Not Present- Frequency, Painful Urination, Pelvic Pain, Vaginal Bleeding, Vaginal Discharge, Contractions, regular, Fetal Movements Decreased, Urinary Complaints and Vaginal Fluid. ?Musculoskeletal: Not Present- Back Pain and Leg Cramps. ?Neurological: Not Present- Dizziness. ?Psychiatric: Not Present- Depression.  ? ?  ?Objective:  ?  Blood pressure (!) 98/53, pulse 85, height 5\' 2"   (1.575 m), weight 198 lb 6 oz (90 kg), last menstrual period 09/16/2021, unknown if currently breastfeeding. Body mass index is 36.28 kg/m?. ? ?General Appearance:    Alert, cooperative, no distress, appears stated age  ?Head:    Normocephalic, without obvious abnormality, atraumatic  ?Eyes:    PERRL, conjunctiva/corneas clear, EOM's intact, both eyes  ?Ears:    Normal external ear canals, both ears  ?Nose:   Nares normal, septum midline, mucosa normal, no drainage or sinus tenderness  ?Throat:   Lips, mucosa, and tongue normal; teeth and gums normal  ?Neck:   Supple, symmetrical, trachea midline, no adenopathy; thyroid: no enlargement/tenderness/nodules; no carotid bruit or JVD  ?Back:     Symmetric, no curvature, ROM normal, no CVA tenderness  ?Lungs:     Clear to auscultation bilaterally, respirations unlabored  ?Chest Wall:    No tenderness or deformity  ? Heart:    Regular rate and rhythm, S1 and S2 normal, no murmur, rub or gallop  ?Breast Exam:    No tenderness, masses, or nipple abnormality  ?Abdomen:     Soft, non-tender, bowel sounds active all four quadrants, no masses, no organomegaly.  FHT 152  bpm.  ?Genitalia:    Pelvic:external genitalia normal, vagina without lesions, discharge, or tenderness, rectovaginal septum  normal. Cervix normal in appearance, no cervical motion tenderness, no adnexal masses or tenderness.  Pregnancy positive findings: uterine enlargement: 21 wk size, nontender.   ?Rectal:    Normal external sphincter.  No hemorrhoids appreciated. Internal exam not done.   ?Extremities:   Extremities normal, atraumatic, no cyanosis or edema  ?Pulses:   2+ and symmetric all extremities  ?Skin:   Skin color, texture, turgor normal, no rashes or lesions  ?Lymph nodes:   Cervical, supraclavicular, and axillary nodes normal  ?Neurologic:   CNII-XII intact, normal strength, sensation and reflexes throughout  ? ?  ?Assessment:  ? ?1. Encounter for supervision of other normal pregnancy in second  trimester   ?2. [redacted] weeks gestation of pregnancy   ?3. Placental abnormality in second trimester   ?4. History of macrosomia in infant in prior pregnancy, currently pregnant   ? ? ?Plan:  ? ?Supervision of normal other pregnancy  ?- Initial labs reviewed. ?- Prenatal vitamins encouraged. ?- Problem list reviewed and updated. ?- New OB counseling:  The patient has been given an overview regarding routine prenatal care.  Recommendations regarding diet, weight gain, and exercise in pregnancy were given. ?- Prenatal testing, optional genetic testing, and ultrasound use in pregnancy were reviewed.  Traditional genetic screening vs cell-fee DNA genetic screening discussed, including risks  and benefits. Testing results reviewed.  Normal genetics.  Patient does not desire to know gender. ?- Benefits of Breast Feeding were discussed. The patient is encouraged to consider nursing her baby post partum. ?- The patient has Medicaid.  [facility] Medicaid Risk Screening Form completed today ? ? ?2. Placental abnormality in second trimester ?-Placenta previa noted on ultrasound.  Discussed diagnosis.  Advised that most previous will resolve by the third trimester.  We will follow-up on next scan which will be her anatomy scan in 1 to 2 weeks.   ?- US OB Comp + 14 Wk; Future ?  ?3.  History of macrosomia ?-Patient with greater than 4K gram infant during her second pregnancy, with no complications.  Subsequent deliveries were slightly smaller.  No concerns at this time but will follow growth during latter portion of the pregnancy. ? ?4.  History of asthma ?-Patient currently not on any medications at this time.  Is asymptomatic. ? ? ? ?Follow up in 4 weeks. ? ? ?Rubie Maid, MD ?[facility] ? ? ?

## 2022-02-18 NOTE — Patient Instructions (Signed)

## 2022-02-20 ENCOUNTER — Ambulatory Visit (INDEPENDENT_AMBULATORY_CARE_PROVIDER_SITE_OTHER): Payer: Medicaid Other

## 2022-02-20 DIAGNOSIS — Z3A20 20 weeks gestation of pregnancy: Secondary | ICD-10-CM

## 2022-02-20 DIAGNOSIS — Z3482 Encounter for supervision of other normal pregnancy, second trimester: Secondary | ICD-10-CM

## 2022-02-20 DIAGNOSIS — O43102 Malformation of placenta, unspecified, second trimester: Secondary | ICD-10-CM

## 2022-03-25 ENCOUNTER — Ambulatory Visit (INDEPENDENT_AMBULATORY_CARE_PROVIDER_SITE_OTHER): Payer: Medicaid Other | Admitting: Obstetrics and Gynecology

## 2022-03-25 ENCOUNTER — Other Ambulatory Visit: Payer: Medicaid Other

## 2022-03-25 ENCOUNTER — Encounter: Payer: Self-pay | Admitting: Obstetrics and Gynecology

## 2022-03-25 VITALS — BP 105/68 | HR 91 | Wt 206.9 lb

## 2022-03-25 DIAGNOSIS — Z23 Encounter for immunization: Secondary | ICD-10-CM | POA: Diagnosis not present

## 2022-03-25 DIAGNOSIS — Z3482 Encounter for supervision of other normal pregnancy, second trimester: Secondary | ICD-10-CM

## 2022-03-25 DIAGNOSIS — Z113 Encounter for screening for infections with a predominantly sexual mode of transmission: Secondary | ICD-10-CM

## 2022-03-25 DIAGNOSIS — Z131 Encounter for screening for diabetes mellitus: Secondary | ICD-10-CM

## 2022-03-25 DIAGNOSIS — Z3A26 26 weeks gestation of pregnancy: Secondary | ICD-10-CM

## 2022-03-25 DIAGNOSIS — Z130 Encounter for screening for diseases of the blood and blood-forming organs and certain disorders involving the immune mechanism: Secondary | ICD-10-CM

## 2022-03-25 DIAGNOSIS — O09299 Supervision of pregnancy with other poor reproductive or obstetric history, unspecified trimester: Secondary | ICD-10-CM

## 2022-03-25 DIAGNOSIS — O479 False labor, unspecified: Secondary | ICD-10-CM

## 2022-03-25 DIAGNOSIS — O4402 Placenta previa specified as without hemorrhage, second trimester: Secondary | ICD-10-CM

## 2022-03-25 LAB — POCT URINALYSIS DIPSTICK OB
Bilirubin, UA: NEGATIVE
Blood, UA: NEGATIVE
Glucose, UA: NEGATIVE
Ketones, UA: NEGATIVE
Leukocytes, UA: NEGATIVE
Nitrite, UA: NEGATIVE
Spec Grav, UA: 1.025 (ref 1.010–1.025)
Urobilinogen, UA: 0.2 E.U./dL
pH, UA: 6.5 (ref 5.0–8.0)

## 2022-03-25 NOTE — Progress Notes (Signed)
ROB: Doing well, no complaints.  Does note feeling some occasional cramping and pain. [employer]. For 28 week labs today.  Plans to breastfeed, desires unsure method for contraception (but notes that she desires no further childbearing). For Tdap today, signed blood consent. RTC in 2 weeks. For repeat scan to f/u possible placenta previa.

## 2022-03-25 NOTE — Progress Notes (Signed)
ROB: She is doing well. She has no new concerns today. 

## 2022-03-26 LAB — CBC
Hematocrit: 34 % (ref 34.0–46.6)
Hemoglobin: 11.5 g/dL (ref 11.1–15.9)
MCH: 28.2 pg (ref 26.6–33.0)
MCHC: 33.8 g/dL (ref 31.5–35.7)
MCV: 83 fL (ref 79–97)
Platelets: 164 10*3/uL (ref 150–450)
RBC: 4.08 x10E6/uL (ref 3.77–5.28)
RDW: 13.9 % (ref 11.7–15.4)
WBC: 7.5 10*3/uL (ref 3.4–10.8)

## 2022-03-26 LAB — RPR: RPR Ser Ql: NONREACTIVE

## 2022-03-26 LAB — GLUCOSE, 1 HOUR GESTATIONAL: Gestational Diabetes Screen: 150 mg/dL — ABNORMAL HIGH (ref 70–139)

## 2022-04-09 ENCOUNTER — Ambulatory Visit (INDEPENDENT_AMBULATORY_CARE_PROVIDER_SITE_OTHER): Payer: Medicaid Other

## 2022-04-09 ENCOUNTER — Ambulatory Visit (INDEPENDENT_AMBULATORY_CARE_PROVIDER_SITE_OTHER): Payer: Medicaid Other | Admitting: Obstetrics and Gynecology

## 2022-04-09 ENCOUNTER — Encounter: Payer: Self-pay | Admitting: Obstetrics and Gynecology

## 2022-04-09 VITALS — BP 99/65 | HR 84 | Wt 206.5 lb

## 2022-04-09 DIAGNOSIS — Z131 Encounter for screening for diabetes mellitus: Secondary | ICD-10-CM

## 2022-04-09 DIAGNOSIS — O4402 Placenta previa specified as without hemorrhage, second trimester: Secondary | ICD-10-CM

## 2022-04-09 DIAGNOSIS — Z3483 Encounter for supervision of other normal pregnancy, third trimester: Secondary | ICD-10-CM

## 2022-04-09 DIAGNOSIS — Z3A29 29 weeks gestation of pregnancy: Secondary | ICD-10-CM | POA: Diagnosis not present

## 2022-04-09 LAB — POCT URINALYSIS DIPSTICK OB
Bilirubin, UA: NEGATIVE
Blood, UA: NEGATIVE
Glucose, UA: NEGATIVE
Ketones, UA: NEGATIVE
Leukocytes, UA: NEGATIVE
Nitrite, UA: NEGATIVE
POC,PROTEIN,UA: NEGATIVE
Spec Grav, UA: 1.025 (ref 1.010–1.025)
Urobilinogen, UA: 0.2 E.U./dL
pH, UA: 6 (ref 5.0–8.0)

## 2022-04-09 NOTE — Progress Notes (Signed)
ROB. Patient states fetal movement with no pain or pressure. She states she is aware she needs to have do a 3 hour glucose test, to be scheduled today. Ultrasound preformed today due to placenta previa concerns. Patient states no questions or concerns at this time.

## 2022-04-09 NOTE — Progress Notes (Signed)
ROB: Had follow-up ultrasound today.  Placenta previa remains resolved.  Patient aware that her 1 hour GCT is elevated and she is going to schedule 3 hour GTT today.  Reports daily fetal movement.  Taking vitamins as directed.

## 2022-04-11 ENCOUNTER — Other Ambulatory Visit: Payer: Medicaid Other

## 2022-04-11 ENCOUNTER — Other Ambulatory Visit: Payer: Self-pay

## 2022-04-11 DIAGNOSIS — Z131 Encounter for screening for diabetes mellitus: Secondary | ICD-10-CM

## 2022-04-11 DIAGNOSIS — Z3483 Encounter for supervision of other normal pregnancy, third trimester: Secondary | ICD-10-CM

## 2022-04-12 LAB — GESTATIONAL GLUCOSE TOLERANCE
Glucose, Fasting: 75 mg/dL (ref 70–94)
Glucose, GTT - 1 Hour: 148 mg/dL (ref 70–179)
Glucose, GTT - 2 Hour: 145 mg/dL (ref 70–154)
Glucose, GTT - 3 Hour: 115 mg/dL (ref 70–139)

## 2022-04-13 NOTE — Progress Notes (Signed)
All results within normal range.

## 2022-04-24 ENCOUNTER — Encounter: Payer: Medicaid Other | Admitting: Obstetrics and Gynecology

## 2022-04-25 ENCOUNTER — Ambulatory Visit (INDEPENDENT_AMBULATORY_CARE_PROVIDER_SITE_OTHER): Payer: Medicaid Other | Admitting: Obstetrics and Gynecology

## 2022-04-25 ENCOUNTER — Encounter: Payer: Self-pay | Admitting: Obstetrics and Gynecology

## 2022-04-25 VITALS — BP 108/53 | HR 100 | Wt 206.7 lb

## 2022-04-25 DIAGNOSIS — Z3A31 31 weeks gestation of pregnancy: Secondary | ICD-10-CM

## 2022-04-25 DIAGNOSIS — R7309 Other abnormal glucose: Secondary | ICD-10-CM | POA: Insufficient documentation

## 2022-04-25 DIAGNOSIS — O3663X0 Maternal care for excessive fetal growth, third trimester, not applicable or unspecified: Secondary | ICD-10-CM

## 2022-04-25 DIAGNOSIS — O4703 False labor before 37 completed weeks of gestation, third trimester: Secondary | ICD-10-CM

## 2022-04-25 LAB — POCT URINALYSIS DIPSTICK OB
Bilirubin, UA: NEGATIVE
Blood, UA: NEGATIVE
Glucose, UA: NEGATIVE
Ketones, UA: NEGATIVE
Nitrite, UA: NEGATIVE
Spec Grav, UA: 1.025 (ref 1.010–1.025)
Urobilinogen, UA: 0.2 E.U./dL
pH, UA: 6 (ref 5.0–8.0)

## 2022-04-25 NOTE — Progress Notes (Signed)
ROB: Patient notes intermittent painful contractions over the past few days. Last night woke up out of her sleep, usually lasts for ~ 1 hr. Denies vaginal bleeding/discharge.  Advised on comfort measures. Discussed practice model, advised on midwifery as first call. Ok to [employer], will schedule. Will need growth scan at 36 weeks for h/o macrosomia, will order.

## 2022-04-25 NOTE — Progress Notes (Signed)
ROB. Patient states some irregular contractions at times.Patient states no other  question or concerns at this time.

## 2022-04-25 NOTE — Patient Instructions (Signed)
Preterm Labor Pregnancy normally lasts 39-41 weeks. Preterm labor is when labor starts before you have been pregnant for 37 weeks. Babies who are born too early may have a higher risk for long-term problems like cerebral palsy or developmental delays. They may also have problems soon after birth, such as problems with blood sugar, body temperature, heart, and breathing. These problems may be very serious in babies who are born before 34 weeks of pregnancy. What are the causes? The cause of this condition is not known. What increases the risk? You are more likely to have preterm labor if: You have medical problems, now or in the past. You have problems now or in your past pregnancies. You have lifestyle problems. Medical history You have problems of the womb (uterus). You have an infection, including infections you get from sex. You have problems that do not go away, such as: Blood clots. High blood pressure. High blood sugar. You have low body weight or too much body weight. Present and past pregnancies You have had preterm labor before. You are pregnant with two babies or more. You have a condition in which the placenta covers your cervix. You waited less than 18 months between giving birth and becoming pregnant again. Your unborn baby has some problems. You have bleeding from your vagina. You became pregnant by a method called IVF. Lifestyle You smoke. You drink alcohol. You use drugs. You have stress. You have abuse in your home. You come in contact with chemicals that harm the body (pollutants). Other factors You are younger than 17 years or older than 35 years. What are the signs or symptoms? Symptoms of this condition include: Cramps. The cramps may feel like cramps from a period. You may also have watery poop (diarrhea). Pain in the belly (abdomen). Pain in the lower back. Regular contractions. It may feel like your belly is getting tighter. Pressure in the lower  belly. More fluid leaking from the vagina. The fluid may be watery or bloody. Water breaking. How is this treated? Treatment for this condition depends on your health, the health of your baby, and how old your pregnancy is. It may include: Taking medicines, such as: Hormone medicines. Medicines to stop contractions. Medicines to help mature the baby's lungs. Medicines to prevent your baby from getting cerebral palsy or other problems. Bed rest. If the labor happens before 34 weeks of pregnancy, you may need to stay in the hospital. Delivering the baby. Follow these instructions at home:  Do not smoke or use any products that contain nicotine or tobacco. If you need help quitting, ask your doctor. Do not drink alcohol. Take over-the-counter and prescription medicines only as told by your doctor. Rest as told by your doctor. Return to your normal activities when your doctor says that it is safe. Keep all follow-up visits. How is this prevented? To have a healthy pregnancy: Do not use drugs. Do not use any medicines unless you ask your doctor if they are safe for you. Talk with your doctor before taking any herbal supplements. Make sure you gain enough weight. Watch for infection. If you think you might have an infection, get it checked right away. Symptoms of infection may include: Fever. Vaginal discharge that smells bad or is not normal. Pain or burning when you pee. Needing to pee urgently. Needing to pee often. Peeing small amounts often. Blood in your pee. Pee that smells bad or unusual. Where to find more information U.S. Department of Health and Human Services   Office on Women's Health: www.womenshealth.gov The American College of Obstetricians and Gynecologists: www.acog.org Centers for Disease Control and Prevention: www.cdc.gov Contact a doctor if: You think you are going into preterm labor. You have symptoms of preterm labor. You have symptoms of infection. Get help  right away if: You are having painful contractions every 5 minutes or less. Your water breaks. Summary Preterm labor is labor that starts before you reach 37 weeks of pregnancy. Your baby may have problems if delivered early. You are more likely to have preterm labor if you have certain medical problems or problems with a pregnancy now or in the past. Some lifestyle factors can also increase the risk. Contact a doctor if you have symptoms of preterm labor. This information is not intended to replace advice given to you by your health care provider. Make sure you discuss any questions you have with your health care provider. Document Revised: 10/16/2020 Document Reviewed: 10/16/2020 Elsevier Patient Education  2023 Elsevier Inc.  

## 2022-04-30 ENCOUNTER — Telehealth: Payer: Self-pay | Admitting: Certified Nurse Midwife

## 2022-04-30 NOTE — Telephone Encounter (Signed)
Left message making pt aware of [place] apt- [facility]- also sent mycahrt message with information.

## 2022-05-14 ENCOUNTER — Ambulatory Visit (INDEPENDENT_AMBULATORY_CARE_PROVIDER_SITE_OTHER): Payer: Medicaid Other | Admitting: Certified Nurse Midwife

## 2022-05-14 ENCOUNTER — Encounter: Payer: Self-pay | Admitting: Certified Nurse Midwife

## 2022-05-14 VITALS — BP 97/64 | HR 98 | Wt 206.1 lb

## 2022-05-14 DIAGNOSIS — Z3A34 34 weeks gestation of pregnancy: Secondary | ICD-10-CM

## 2022-05-14 NOTE — Progress Notes (Signed)
ROB doing well, feels good fetal movement. Discussed GBS and swabs to be collected next visit. She verbalizes and agrees. Pt feels breech with [employer] Ohm, pt states the baby was breech at the last ultrasound and that she continues to feel movement down low. FHT auscultate above umbilicus. Discussed spinning babies. PT encouraged to start doing at home spinning babies maneuvers to encourage fetal rotation. She verbalizes and agrees to plan of care. Follow up 2 wk for ROB with Missy .

## 2022-05-14 NOTE — Patient Instructions (Signed)

## 2022-05-28 ENCOUNTER — Other Ambulatory Visit (HOSPITAL_COMMUNITY)
Admission: RE | Admit: 2022-05-28 | Discharge: 2022-05-28 | Disposition: A | Discharge status: Home / Self Care | Payer: Medicaid Other | Source: Ambulatory Visit | Attending: Certified Nurse Midwife | Admitting: Certified Nurse Midwife

## 2022-05-28 ENCOUNTER — Ambulatory Visit (INDEPENDENT_AMBULATORY_CARE_PROVIDER_SITE_OTHER): Payer: Medicaid Other | Admitting: Certified Nurse Midwife

## 2022-05-28 ENCOUNTER — Ambulatory Visit
Admission: RE | Admit: 2022-05-28 | Discharge: 2022-05-28 | Disposition: A | Discharge status: Home / Self Care | Payer: Medicaid Other | Source: Ambulatory Visit | Attending: Obstetrics and Gynecology | Admitting: Obstetrics and Gynecology

## 2022-05-28 VITALS — BP 116/74 | HR 96 | Wt 206.5 lb

## 2022-05-28 DIAGNOSIS — Z3483 Encounter for supervision of other normal pregnancy, third trimester: Secondary | ICD-10-CM | POA: Diagnosis present

## 2022-05-28 DIAGNOSIS — O3663X0 Maternal care for excessive fetal growth, third trimester, not applicable or unspecified: Secondary | ICD-10-CM | POA: Diagnosis not present

## 2022-05-28 DIAGNOSIS — Z3A36 36 weeks gestation of pregnancy: Secondary | ICD-10-CM

## 2022-05-28 NOTE — Patient Instructions (Signed)

## 2022-05-28 NOTE — Progress Notes (Signed)
ROB doing well, pt state she feel most of the movement low in her vagina. GBS and gc/chlamydia today. Reviewed with pt. She verbalizes understanding. Pt state she has been utilizing spinning babies technics at home. She state she does not feel like the baby has rotated. Difficult to discern fetal position wit Leopold's. FHT auscultated mid abdomen. Pt state she has an u/s scheduled for tomorrow. Information of moxibustion given to patient. Pt encouraged to use should baby be breech on u/s tomorrow. Discussed option of cesarean section and external version if baby is breech. She states she just has been hoping baby will turn on its own.  Follow up 1 wk for ROB.   Doreene Burke, CNM

## 2022-05-30 LAB — GC/CHLAMYDIA PROBE AMP (~~LOC~~) NOT AT ARMC
Chlamydia: NEGATIVE
Comment: NEGATIVE
Comment: NORMAL
Neisseria Gonorrhea: NEGATIVE

## 2022-06-03 ENCOUNTER — Encounter: Payer: Self-pay | Admitting: Obstetrics and Gynecology

## 2022-06-03 ENCOUNTER — Encounter: Payer: Medicaid Other | Admitting: Certified Nurse Midwife

## 2022-06-03 ENCOUNTER — Ambulatory Visit (INDEPENDENT_AMBULATORY_CARE_PROVIDER_SITE_OTHER): Payer: Medicaid Other | Admitting: Obstetrics and Gynecology

## 2022-06-03 VITALS — BP 97/65 | HR 82 | Wt 206.8 lb

## 2022-06-03 DIAGNOSIS — Z3483 Encounter for supervision of other normal pregnancy, third trimester: Secondary | ICD-10-CM

## 2022-06-03 DIAGNOSIS — Z3685 Encounter for antenatal screening for Streptococcus B: Secondary | ICD-10-CM

## 2022-06-03 DIAGNOSIS — Z3A37 37 weeks gestation of pregnancy: Secondary | ICD-10-CM

## 2022-06-03 DIAGNOSIS — O329XX0 Maternal care for malpresentation of fetus, unspecified, not applicable or unspecified: Secondary | ICD-10-CM

## 2022-06-03 LAB — POCT URINALYSIS DIPSTICK OB
Bilirubin, UA: NEGATIVE
Blood, UA: NEGATIVE
Glucose, UA: NEGATIVE
Ketones, UA: NEGATIVE
Leukocytes, UA: NEGATIVE
Nitrite, UA: NEGATIVE
Spec Grav, UA: 1.02 (ref 1.010–1.025)
Urobilinogen, UA: 0.2 E.U./dL
pH, UA: 6.5 (ref 5.0–8.0)

## 2022-06-03 NOTE — Progress Notes (Signed)
ROB: Patient doing well, no major issues.  Recent growth scan last week notes normal growth, but "variable" presentation. Patient reports infant flipped sideways during ultrasound.  Exam today concerning for non-vertex presentation. GBS performed today.  Will f/u next week with [place] for presentation. If breech, may consider version, or would need counseling for C-section.

## 2022-06-03 NOTE — Progress Notes (Signed)
ROB: She is doing well today. No new concerns.

## 2022-06-05 ENCOUNTER — Encounter: Payer: Self-pay | Admitting: Obstetrics and Gynecology

## 2022-06-05 LAB — STREP GP B NAA: Strep Gp B NAA: POSITIVE — AB

## 2022-06-09 ENCOUNTER — Ambulatory Visit (INDEPENDENT_AMBULATORY_CARE_PROVIDER_SITE_OTHER): Payer: Medicaid Other

## 2022-06-09 DIAGNOSIS — O329XX0 Maternal care for malpresentation of fetus, unspecified, not applicable or unspecified: Secondary | ICD-10-CM | POA: Diagnosis not present

## 2022-06-09 DIAGNOSIS — Z3A36 36 weeks gestation of pregnancy: Secondary | ICD-10-CM

## 2022-06-10 ENCOUNTER — Ambulatory Visit (INDEPENDENT_AMBULATORY_CARE_PROVIDER_SITE_OTHER): Payer: Medicaid Other | Admitting: Obstetrics and Gynecology

## 2022-06-10 ENCOUNTER — Encounter: Payer: Self-pay | Admitting: Obstetrics and Gynecology

## 2022-06-10 VITALS — BP 97/63 | HR 84 | Wt 207.1 lb

## 2022-06-10 DIAGNOSIS — Z3A38 38 weeks gestation of pregnancy: Secondary | ICD-10-CM

## 2022-06-10 DIAGNOSIS — Z3A36 36 weeks gestation of pregnancy: Secondary | ICD-10-CM

## 2022-06-10 DIAGNOSIS — Z3483 Encounter for supervision of other normal pregnancy, third trimester: Secondary | ICD-10-CM

## 2022-06-10 LAB — POCT URINALYSIS DIPSTICK OB
Bilirubin, UA: NEGATIVE
Blood, UA: NEGATIVE
Glucose, UA: NEGATIVE
Ketones, UA: NEGATIVE
Nitrite, UA: NEGATIVE
POC,PROTEIN,UA: NEGATIVE
Spec Grav, UA: 1.01 (ref 1.010–1.025)
Urobilinogen, UA: 0.2 E.U./dL
pH, UA: 7.5 (ref 5.0–8.0)

## 2022-06-10 NOTE — Progress Notes (Signed)
ROB. Patient states daily fetal movement. Ultrasound yesterday for positioning, baby is still breech. Patient aware she is GBS+. Patient states no questions or concerns at this time.

## 2022-06-10 NOTE — Progress Notes (Signed)
ROB: Doing well.  Baby remains footling breech.  We have discussed the possibility of ECV.  Timing of ECV discussed.  Patient would like to think about this.  I have asked her to let us know at her next visit which where she has decided.  GBS discussed.

## 2022-06-12 ENCOUNTER — Encounter: Payer: Self-pay | Admitting: Obstetrics and Gynecology

## 2022-06-19 ENCOUNTER — Encounter: Payer: Self-pay | Admitting: Obstetrics and Gynecology

## 2022-06-19 ENCOUNTER — Ambulatory Visit (INDEPENDENT_AMBULATORY_CARE_PROVIDER_SITE_OTHER): Payer: Medicaid Other | Admitting: Obstetrics and Gynecology

## 2022-06-19 VITALS — BP 104/67 | HR 83 | Wt 208.0 lb

## 2022-06-19 DIAGNOSIS — Z3483 Encounter for supervision of other normal pregnancy, third trimester: Secondary | ICD-10-CM

## 2022-06-19 DIAGNOSIS — Z01812 Encounter for preprocedural laboratory examination: Secondary | ICD-10-CM

## 2022-06-19 DIAGNOSIS — Z3A37 37 weeks gestation of pregnancy: Secondary | ICD-10-CM

## 2022-06-19 LAB — POCT URINALYSIS DIPSTICK OB
Bilirubin, UA: NEGATIVE
Blood, UA: NEGATIVE
Glucose, UA: NEGATIVE
Ketones, UA: NEGATIVE
Nitrite, UA: NEGATIVE
Spec Grav, UA: 1.015 (ref 1.010–1.025)
Urobilinogen, UA: 0.2 E.U./dL
pH, UA: 6.5 (ref 5.0–8.0)

## 2022-06-19 NOTE — Progress Notes (Signed)
ROB.Patient states no questions or concerns at this time.  

## 2022-06-19 NOTE — Patient Instructions (Signed)
Cesarean Delivery Cesarean birth, or cesarean delivery, is the surgical delivery of a baby through an incision in the abdomen and the uterus. This may be referred to as a C-section. This procedure may be scheduled ahead of time, or it may be done in an emergency situation. Tell a health care provider about: Any allergies you have. All medicines you are taking, including vitamins, herbs, eye drops, creams, and over-the-counter medicines. Any problems you or family members have had with anesthetic medicines. Any bleeding problems you have. Any surgeries you have had. Any medical conditions you have. Whether you or any members of your family have a history of deep vein thrombosis (DVT) or pulmonary embolism (PE). What are the risks? Generally, this is a safe procedure. However, problems may occur, including: Infection. Bleeding. Allergic reactions to medicines. Damage to other structures or organs. Blood clots. Injury to your baby. What happens before the procedure? Medicines Ask your health care provider about: Changing or stopping your regular medicines. This is especially important if you are taking diabetes medicines or blood thinners. Taking medicines such as aspirin and ibuprofen. These medicines can thin your blood. Do not take these medicines unless your health care provider tells you to take them. Taking over-the-counter medicines, vitamins, herbs, and supplements. Tests Depending on the reason for your cesarean delivery, you may have a physical exam or additional testing, such as an ultrasound. You may have your blood or urine tested. General instructions Follow instructions from your health care provider about eating or drinking restrictions. Do not shave your pubic area if you know that you are going to have a cesarean delivery. Shaving before the procedure may increase your risk of infection. Plan to have someone take you home from the hospital. Ask your health care provider  what steps will be taken to prevent infection. These may include: Washing skin with a germ-killing soap. Taking antibiotic medicine. Questions for your health care provider Ask your health care provider about: Your pain management plan. This is especially important if you plan to breastfeed your baby. How long you will be in the hospital after the procedure. Any concerns you may have about receiving blood products, if you need them during the procedure. Cord blood banking, if you plan to collect your baby's umbilical cord blood. You may also want to ask your health care provider: Whether you will be able to hold or breastfeed your baby while you are still in the operating room. Whether your baby can stay with you immediately after the procedure and during your recovery. Whether a family member or a person of your choice can go with you into the operating room and stay with you during the procedure, immediately after the procedure, and during your recovery. What happens during the procedure?  An IV will be inserted into one of your veins. Fluid and medicines, such as antibiotics, will be given before the surgery. Fetal monitors will be placed on your abdomen to check your baby's heart rate. You may be given a warming gown to wear to keep your temperature stable. A catheter may be inserted into your bladder through your urethra. This drains your urine during the procedure. You may be given one or more of the following: A medicine to numb the area (local anesthetic). A medicine to make you fall asleep (general anesthetic). A medicine (regional anesthetic) that is injected into your back or through a small thin tube placed in your back (spinal anesthetic or epidural anesthetic). This numbs everything below the  injection site and allows you to stay awake during your procedure. If this makes you feel nauseous, tell your health care provider. Medicines will be available to help reduce any nausea you  may feel. An incision will be made in your abdomen, and then in your uterus. If you are awake during your procedure, you may feel tugging and pulling in your abdomen, but you should not feel pain. If you feel pain, tell your health care provider immediately. Your baby will be removed from your uterus. You may feel more pressure or pushing while this happens. Immediately after birth, your baby will be dried and kept warm. You may be able to hold and breastfeed your baby. The umbilical cord may be clamped and cut during this time. This usually occurs after waiting a period of 1-2 minutes after delivery. Your placenta will be removed from your uterus. Your incisions will be closed with stitches (sutures). Staples, skin glue, or adhesive strips may also be applied to the incision in your abdomen. Bandages (dressings) may be placed over the incision in your abdomen. The procedure may vary among health care providers and hospitals. What happens after the procedure? Your blood pressure, heart rate, breathing rate, and blood oxygen level will be monitored until you leave the hospital or clinic. You may continue to receive fluids and medicines through an IV. You will have some pain. Medicines will be available to help control your pain. To help prevent blood clots: You may be given medicines. You may have to wear compression stockings or devices. You will be encouraged to walk around when you are able. Hospital staff will encourage and support bonding with your baby. Your hospital may have you and your baby stay in the same room (rooming in) during your hospital stay to encourage successful bonding and breastfeeding. You may be encouraged to cough and breathe deeply often. This helps to prevent lung problems. If you have a catheter draining your urine, it will be removed as soon as possible after your procedure. Summary Cesarean birth, or cesarean delivery, is the surgical delivery of a baby through an  incision in the abdomen and the uterus. Follow instructions from your health care provider about eating or drinking restrictions before the procedure. You will have some pain after the procedure. Medicines will be available to help control your pain. Hospital staff will encourage and support bonding with your baby after the procedure. Your hospital may have you and your baby stay in the same room (rooming in) during your hospital stay to encourage successful bonding and breastfeeding. This information is not intended to replace advice given to you by your health care provider. Make sure you discuss any questions you have with your health care provider. Document Revised: 05/15/2021 Document Reviewed: 05/15/2021 Elsevier Patient Education  2023 Elsevier Inc.  

## 2022-06-19 NOTE — Progress Notes (Signed)
ROB: Patient doing well overall, denies complaints. Notes that she and her partner have decided not to have ECV performed.  Discussed plans for C-section with patient, including nature of procedure, risks, postpartum expectations.  Patient notes understanding. Will schedule in 39th week, tentatively for  9/5. RTC in 1 week. Discussed labor precautions as well.

## 2022-06-24 ENCOUNTER — Other Ambulatory Visit: Payer: Self-pay | Admitting: Obstetrics and Gynecology

## 2022-06-24 ENCOUNTER — Telehealth: Payer: Self-pay | Admitting: Obstetrics and Gynecology

## 2022-06-24 ENCOUNTER — Encounter: Payer: Self-pay | Admitting: Obstetrics and Gynecology

## 2022-06-24 NOTE — Telephone Encounter (Signed)
Spoke with patient made her aware that c-section scheduled for 9/5, she needs to report to L/D going through ER by 10 (advised to be alittle early), NPO at midnight. I reminded pt of apt in office 8/31 and to bring any questions! Pt verbalized understanding  Pt has [employer] - does not require PA

## 2022-06-26 ENCOUNTER — Encounter: Payer: Self-pay | Admitting: Obstetrics and Gynecology

## 2022-06-26 ENCOUNTER — Ambulatory Visit (INDEPENDENT_AMBULATORY_CARE_PROVIDER_SITE_OTHER): Payer: Medicaid Other | Admitting: Obstetrics and Gynecology

## 2022-06-26 VITALS — BP 99/67 | HR 77 | Wt 208.7 lb

## 2022-06-26 DIAGNOSIS — Z3A38 38 weeks gestation of pregnancy: Secondary | ICD-10-CM

## 2022-06-26 DIAGNOSIS — Z3483 Encounter for supervision of other normal pregnancy, third trimester: Secondary | ICD-10-CM

## 2022-06-26 LAB — POCT URINALYSIS DIPSTICK OB
Bilirubin, UA: NEGATIVE
Blood, UA: NEGATIVE
Glucose, UA: NEGATIVE
Ketones, UA: NEGATIVE
Nitrite, UA: NEGATIVE
POC,PROTEIN,UA: NEGATIVE
Spec Grav, UA: 1.015 (ref 1.010–1.025)
Urobilinogen, UA: 0.2 E.U./dL
pH, UA: 6 (ref 5.0–8.0)

## 2022-06-26 NOTE — Progress Notes (Signed)
ROB. Patient states daily fetal movement with no pain or pressure. Patient states no questions or concerns at this time.

## 2022-06-26 NOTE — Progress Notes (Signed)
ROB: No complaints.  Cesarean for transverse footling breech scheduled next week.  Palpation of the abdomen reveals there is no vertex presenting part in the pelvis.  All questions regarding cesarean delivery answered.  Patient strongly encouraged to present to labor and delivery without delay if she goes into labor or breaks her water.

## 2022-06-27 ENCOUNTER — Encounter
Admission: RE | Admit: 2022-06-27 | Discharge: 2022-06-27 | Disposition: A | Discharge status: Home / Self Care | Payer: Medicaid Other | Source: Ambulatory Visit | Attending: Obstetrics and Gynecology | Admitting: Obstetrics and Gynecology

## 2022-06-27 NOTE — Patient Instructions (Signed)
Your procedure is scheduled on: 07/01/22 Report to [facility] at 10 am. Judee Clara Parking is available free of charge at the [employer].  Check in at the Admitting/Registrion desk before heading to the [facility] on the 3rd floor. Wheelchair and volunteer services are also available if needed If you have any questions regarding visitation etc you may contact the [facility] at [phone]  Remember: Instructions that are not followed completely may result in serious medical risk, up to and including death, or upon the discretion of your surgeon and anesthesiologist your surgery may need to be rescheduled.     _X__ 1. Do not eat food or drink any liquids after midnight the night before your procedure.                 No gum chewing or hard candies.   __X__2.  On the morning of surgery brush your teeth with toothpaste and water, you                 may rinse your mouth with mouthwash if you wish.  Do not swallow any              toothpaste of mouthwash.     _X__ 3.  No Alcohol for 24 hours before or after surgery.   _X__ 4.  Do Not Smoke or use e-cigarettes For 24 Hours Prior to Your Surgery.                 Do not use any chewable tobacco products for at least 6 hours prior to                 surgery.  ____  5.  Bring all medications with you on the day of surgery if instructed.   __X__  6.  Notify your doctor if there is any change in your medical condition      (cold, fever, infections).     Do not wear jewelry, make-up, hairpins, clips or nail polish. Do not wear lotions, powders, or perfumes.  Do not shave body hair 48 hours prior to surgery. Men may shave face and neck. Do not bring valuables to the hospital.    [facility] is not responsible for any belongings or valuables.  Contacts, dentures/partials or body piercings may not be worn into surgery. Bring a case for your contacts, glasses or hearing aids, a denture cup will be supplied. Leave your suitcase in  the car. After surgery it may be brought to your room. For patients admitted to the hospital, discharge time is determined by your treatment team.   Patients discharged the day of surgery will not be allowed to drive home.   __X__ Take these medicines the morning of surgery with A SIP OF WATER:    1. none  2.   3.   4.  5.  6.  ____ Fleet Enema (as directed)   ____ Use CHG Soap/SAGE wipes as directed  ____ Use inhalers on the day of surgery  ____ Stop metformin/Janumet/Farxiga 2 days prior to surgery    ____ Take 1/2 of usual insulin dose the night before surgery. No insulin the morning          of surgery.   ____ Stop Blood Thinners Coumadin/Plavix/Xarelto/Pleta/Pradaxa/Eliquis/Effient/Aspirin  on   Or contact your Surgeon, Cardiologist or Medical Doctor regarding  ability to stop your blood thinners  __X__ Stop Anti-inflammatories 7 days before surgery such as Advil, Ibuprofen, Motrin,  BC  or Goodies Powder, Naprosyn, Naproxen, Aleve, Aspirin    ____ Stop all herbals and supplements, fish oil or vitamins  until after surgery.    ____ Bring C-Pap to the hospital.

## 2022-07-01 ENCOUNTER — Inpatient Hospital Stay
Admission: RE | Admit: 2022-07-01 | Discharge: 2022-07-04 | DRG: 787 | Disposition: A | Discharge status: Home / Self Care | Payer: Medicaid Other | Attending: Obstetrics and Gynecology | Admitting: Obstetrics and Gynecology

## 2022-07-01 ENCOUNTER — Encounter
Admission: RE | Disposition: A | Discharge status: Home / Self Care | Payer: Self-pay | Source: Home / Self Care | Attending: Obstetrics and Gynecology

## 2022-07-01 ENCOUNTER — Encounter: Payer: Self-pay | Admitting: Obstetrics and Gynecology

## 2022-07-01 ENCOUNTER — Other Ambulatory Visit: Payer: Self-pay

## 2022-07-01 ENCOUNTER — Inpatient Hospital Stay: Payer: Medicaid Other | Admitting: Anesthesiology

## 2022-07-01 ENCOUNTER — Other Ambulatory Visit: Payer: Medicaid Other

## 2022-07-01 DIAGNOSIS — O99824 Streptococcus B carrier state complicating childbirth: Secondary | ICD-10-CM | POA: Diagnosis present

## 2022-07-01 DIAGNOSIS — D62 Acute posthemorrhagic anemia: Secondary | ICD-10-CM | POA: Diagnosis not present

## 2022-07-01 DIAGNOSIS — O329XX0 Maternal care for malpresentation of fetus, unspecified, not applicable or unspecified: Secondary | ICD-10-CM | POA: Diagnosis present

## 2022-07-01 DIAGNOSIS — D500 Iron deficiency anemia secondary to blood loss (chronic): Secondary | ICD-10-CM | POA: Diagnosis not present

## 2022-07-01 DIAGNOSIS — O9081 Anemia of the puerperium: Secondary | ICD-10-CM | POA: Diagnosis not present

## 2022-07-01 DIAGNOSIS — Z3480 Encounter for supervision of other normal pregnancy, unspecified trimester: Secondary | ICD-10-CM

## 2022-07-01 DIAGNOSIS — O321XX0 Maternal care for breech presentation, not applicable or unspecified: Secondary | ICD-10-CM | POA: Diagnosis not present

## 2022-07-01 DIAGNOSIS — Z8759 Personal history of other complications of pregnancy, childbirth and the puerperium: Secondary | ICD-10-CM

## 2022-07-01 DIAGNOSIS — O328XX0 Maternal care for other malpresentation of fetus, not applicable or unspecified: Secondary | ICD-10-CM | POA: Diagnosis present

## 2022-07-01 DIAGNOSIS — Z3A39 39 weeks gestation of pregnancy: Secondary | ICD-10-CM

## 2022-07-01 DIAGNOSIS — Z01812 Encounter for preprocedural laboratory examination: Secondary | ICD-10-CM

## 2022-07-01 DIAGNOSIS — O09299 Supervision of pregnancy with other poor reproductive or obstetric history, unspecified trimester: Secondary | ICD-10-CM

## 2022-07-01 DIAGNOSIS — O9903 Anemia complicating the puerperium: Secondary | ICD-10-CM | POA: Diagnosis not present

## 2022-07-01 DIAGNOSIS — Z98891 History of uterine scar from previous surgery: Principal | ICD-10-CM

## 2022-07-01 LAB — CBC
HCT: 37.3 % (ref 36.0–46.0)
Hemoglobin: 12.1 g/dL (ref 12.0–15.0)
MCH: 27 pg (ref 26.0–34.0)
MCHC: 32.4 g/dL (ref 30.0–36.0)
MCV: 83.3 fL (ref 80.0–100.0)
Platelets: 151 10*3/uL (ref 150–400)
RBC: 4.48 MIL/uL (ref 3.87–5.11)
RDW: 15 % (ref 11.5–15.5)
WBC: 7.6 10*3/uL (ref 4.0–10.5)
nRBC: 0 % (ref 0.0–0.2)

## 2022-07-01 LAB — HEMOGLOBIN AND HEMATOCRIT, BLOOD
HCT: 30.4 % — ABNORMAL LOW (ref 36.0–46.0)
Hemoglobin: 9.8 g/dL — ABNORMAL LOW (ref 12.0–15.0)

## 2022-07-01 SURGERY — Surgical Case
Anesthesia: Spinal

## 2022-07-01 MED ORDER — ACETAMINOPHEN 500 MG PO TABS
1000.0000 mg | ORAL_TABLET | Freq: Four times a day (QID) | ORAL | Status: DC
  Administered 2022-07-01 – 2022-07-04 (×11): 1000 mg via ORAL
  Filled 2022-07-01 (×11): qty 2

## 2022-07-01 MED ORDER — COCONUT OIL OIL: 1.0000 | TOPICAL_OIL | Status: DC | PRN

## 2022-07-01 MED ORDER — OXYTOCIN-SODIUM CHLORIDE 30-0.9 UT/500ML-% IV SOLN
2.5000 [IU]/h | INTRAVENOUS | Status: AC
  Administered 2022-07-01: 2.5 [IU]/h via INTRAVENOUS

## 2022-07-01 MED ORDER — MEASLES, MUMPS & RUBELLA VAC IJ SOLR
0.5000 mL | Freq: Once | INTRAMUSCULAR | Status: DC
  Filled 2022-07-01: qty 0.5

## 2022-07-01 MED ORDER — POVIDONE-IODINE 10 % EX SWAB
2.0000 | Freq: Once | CUTANEOUS | Status: AC
  Administered 2022-07-01: 2 via TOPICAL

## 2022-07-01 MED ORDER — BUPIVACAINE HCL (PF) 0.5 % IJ SOLN
INTRAMUSCULAR | Status: AC
  Filled 2022-07-01: qty 30

## 2022-07-01 MED ORDER — SOD CITRATE-CITRIC ACID 500-334 MG/5ML PO SOLN: 30.0000 mL | ORAL | Status: AC

## 2022-07-01 MED ORDER — PHENYLEPHRINE HCL-NACL 20-0.9 MG/250ML-% IV SOLN
INTRAVENOUS | Status: DC | PRN
  Administered 2022-07-01: 30 ug/min via INTRAVENOUS

## 2022-07-01 MED ORDER — DIPHENHYDRAMINE HCL 50 MG/ML IJ SOLN: 12.5000 mg | INTRAMUSCULAR | Status: DC | PRN

## 2022-07-01 MED ORDER — BUPIVACAINE LIPOSOME 1.3 % IJ SUSP
INTRAMUSCULAR | Status: AC
  Filled 2022-07-01: qty 20

## 2022-07-01 MED ORDER — CEFAZOLIN SODIUM-DEXTROSE 2-4 GM/100ML-% IV SOLN
INTRAVENOUS | Status: AC
  Filled 2022-07-01: qty 100

## 2022-07-01 MED ORDER — GABAPENTIN 300 MG PO CAPS
300.0000 mg | ORAL_CAPSULE | ORAL | Status: AC
  Administered 2022-07-01: 300 mg via ORAL
  Filled 2022-07-01: qty 1

## 2022-07-01 MED ORDER — NALOXONE HCL 0.4 MG/ML IJ SOLN: 0.4000 mg | INTRAMUSCULAR | Status: DC | PRN

## 2022-07-01 MED ORDER — SCOPOLAMINE 1 MG/3DAYS TD PT72
1.0000 | MEDICATED_PATCH | Freq: Once | TRANSDERMAL | Status: AC
  Administered 2022-07-01: 1.5 mg via TRANSDERMAL
  Filled 2022-07-01: qty 1

## 2022-07-01 MED ORDER — TRAMADOL HCL 50 MG PO TABS: 50.0000 mg | ORAL_TABLET | Freq: Four times a day (QID) | ORAL | Status: DC | PRN

## 2022-07-01 MED ORDER — SOD CITRATE-CITRIC ACID 500-334 MG/5ML PO SOLN
ORAL | Status: AC
  Administered 2022-07-01: 30 mL via ORAL
  Filled 2022-07-01: qty 15

## 2022-07-01 MED ORDER — BUPIVACAINE-MELOXICAM ER 400-12 MG/14ML IJ SOLN
INTRAMUSCULAR | Status: AC
  Filled 2022-07-01: qty 1

## 2022-07-01 MED ORDER — ACETAMINOPHEN 500 MG PO TABS: 1000.0000 mg | ORAL_TABLET | Freq: Four times a day (QID) | ORAL | Status: DC

## 2022-07-01 MED ORDER — GABAPENTIN 300 MG PO CAPS
300.0000 mg | ORAL_CAPSULE | Freq: Two times a day (BID) | ORAL | Status: DC
  Administered 2022-07-01 – 2022-07-04 (×6): 300 mg via ORAL
  Filled 2022-07-01 (×6): qty 1

## 2022-07-01 MED ORDER — TRANEXAMIC ACID-NACL 1000-0.7 MG/100ML-% IV SOLN
1000.0000 mg | Freq: Once | INTRAVENOUS | Status: AC
  Administered 2022-07-01: 1000 mg via INTRAVENOUS
  Filled 2022-07-01: qty 100

## 2022-07-01 MED ORDER — FENTANYL CITRATE (PF) 100 MCG/2ML IJ SOLN
INTRAMUSCULAR | Status: DC | PRN
  Administered 2022-07-01: 15 ug via INTRATHECAL

## 2022-07-01 MED ORDER — MAGNESIUM HYDROXIDE 400 MG/5ML PO SUSP
30.0000 mL | ORAL | Status: DC | PRN
Start: 2022-07-01 — End: 2022-07-04

## 2022-07-01 MED ORDER — LACTATED RINGERS IV BOLUS
1000.0000 mL | Freq: Once | INTRAVENOUS | Status: AC
  Administered 2022-07-01: 1000 mL via INTRAVENOUS

## 2022-07-01 MED ORDER — DIBUCAINE (PERIANAL) 1 % EX OINT: 1.0000 | TOPICAL_OINTMENT | CUTANEOUS | Status: DC | PRN

## 2022-07-01 MED ORDER — KETOROLAC TROMETHAMINE 30 MG/ML IJ SOLN
INTRAMUSCULAR | Status: AC
  Filled 2022-07-01: qty 1

## 2022-07-01 MED ORDER — LACTATED RINGERS IV SOLN: Freq: Once | INTRAVENOUS | Status: AC

## 2022-07-01 MED ORDER — ZOLPIDEM TARTRATE 5 MG PO TABS: 5.0000 mg | ORAL_TABLET | Freq: Every evening | ORAL | Status: DC | PRN

## 2022-07-01 MED ORDER — LACTATED RINGERS IV SOLN: INTRAVENOUS | Status: DC

## 2022-07-01 MED ORDER — ACETAMINOPHEN 500 MG PO TABS
1000.0000 mg | ORAL_TABLET | ORAL | Status: AC
  Administered 2022-07-01: 1000 mg via ORAL
  Filled 2022-07-01: qty 2

## 2022-07-01 MED ORDER — IBUPROFEN 600 MG PO TABS
600.0000 mg | ORAL_TABLET | Freq: Four times a day (QID) | ORAL | Status: DC
  Administered 2022-07-02 – 2022-07-04 (×7): 600 mg via ORAL
  Filled 2022-07-01 (×7): qty 1

## 2022-07-01 MED ORDER — DIPHENHYDRAMINE HCL 25 MG PO CAPS: 25.0000 mg | ORAL_CAPSULE | ORAL | Status: DC | PRN

## 2022-07-01 MED ORDER — BUPIVACAINE LIPOSOME 1.3 % IJ SUSP
INTRAMUSCULAR | Status: DC | PRN
  Administered 2022-07-01: 100 mL

## 2022-07-01 MED ORDER — OXYTOCIN-SODIUM CHLORIDE 30-0.9 UT/500ML-% IV SOLN
INTRAVENOUS | Status: DC | PRN
  Administered 2022-07-01: 30 [IU] via INTRAVENOUS

## 2022-07-01 MED ORDER — SENNOSIDES-DOCUSATE SODIUM 8.6-50 MG PO TABS
2.0000 | ORAL_TABLET | Freq: Every day | ORAL | Status: DC
  Administered 2022-07-02 – 2022-07-04 (×3): 2 via ORAL
  Filled 2022-07-01 (×3): qty 2

## 2022-07-01 MED ORDER — LIDOCAINE HCL (PF) 1 % IJ SOLN
INTRAMUSCULAR | Status: DC | PRN
  Administered 2022-07-01: 3 mL via INTRADERMAL

## 2022-07-01 MED ORDER — MENTHOL 3 MG MT LOZG
1.0000 | LOZENGE | OROMUCOSAL | Status: DC | PRN
Start: 2022-07-01 — End: 2022-07-04

## 2022-07-01 MED ORDER — SIMETHICONE 80 MG PO CHEW
80.0000 mg | CHEWABLE_TABLET | ORAL | Status: DC | PRN
  Administered 2022-07-02 (×2): 80 mg via ORAL
  Filled 2022-07-01 (×2): qty 1

## 2022-07-01 MED ORDER — CEFAZOLIN SODIUM-DEXTROSE 2-4 GM/100ML-% IV SOLN
2.0000 g | INTRAVENOUS | Status: AC
  Administered 2022-07-01: 2 g via INTRAVENOUS

## 2022-07-01 MED ORDER — WITCH HAZEL-GLYCERIN EX PADS: 1.0000 | MEDICATED_PAD | CUTANEOUS | Status: DC | PRN

## 2022-07-01 MED ORDER — HYDROMORPHONE HCL 1 MG/ML IJ SOLN
1.0000 mg | INTRAMUSCULAR | Status: DC | PRN
  Administered 2022-07-02: 1 mg via INTRAVENOUS
  Filled 2022-07-01: qty 1

## 2022-07-01 MED ORDER — FENTANYL CITRATE (PF) 100 MCG/2ML IJ SOLN
INTRAMUSCULAR | Status: AC
  Filled 2022-07-01: qty 2

## 2022-07-01 MED ORDER — FERROUS SULFATE 325 (65 FE) MG PO TABS
325.0000 mg | ORAL_TABLET | ORAL | Status: DC
  Administered 2022-07-02 – 2022-07-04 (×2): 325 mg via ORAL
  Filled 2022-07-01 (×2): qty 1

## 2022-07-01 MED ORDER — OXYCODONE HCL 5 MG PO TABS
5.0000 mg | ORAL_TABLET | Freq: Four times a day (QID) | ORAL | Status: DC | PRN
  Administered 2022-07-03: 5 mg via ORAL
  Filled 2022-07-01 (×3): qty 1

## 2022-07-01 MED ORDER — KETOROLAC TROMETHAMINE 30 MG/ML IJ SOLN
30.0000 mg | Freq: Four times a day (QID) | INTRAMUSCULAR | Status: AC | PRN
Start: 2022-07-01 — End: 2022-07-02

## 2022-07-01 MED ORDER — NALOXONE HCL 4 MG/10ML IJ SOLN: 1.0000 ug/kg/h | INTRAVENOUS | Status: DC | PRN

## 2022-07-01 MED ORDER — METHYLERGONOVINE MALEATE 0.2 MG/ML IJ SOLN: 0.2000 mg | INTRAMUSCULAR | Status: DC | PRN

## 2022-07-01 MED ORDER — KETOROLAC TROMETHAMINE 30 MG/ML IJ SOLN
30.0000 mg | Freq: Four times a day (QID) | INTRAMUSCULAR | Status: AC
  Administered 2022-07-01 – 2022-07-02 (×3): 30 mg via INTRAVENOUS
  Filled 2022-07-01 (×3): qty 1

## 2022-07-01 MED ORDER — PHENYLEPHRINE HCL-NACL 20-0.9 MG/250ML-% IV SOLN
INTRAVENOUS | Status: AC
  Filled 2022-07-01: qty 250

## 2022-07-01 MED ORDER — ONDANSETRON HCL 4 MG/2ML IJ SOLN
INTRAMUSCULAR | Status: DC | PRN
  Administered 2022-07-01: 4 mg via INTRAVENOUS

## 2022-07-01 MED ORDER — PRENATAL MULTIVITAMIN CH
1.0000 | ORAL_TABLET | Freq: Every day | ORAL | Status: DC
  Administered 2022-07-02 – 2022-07-03 (×2): 1 via ORAL
  Filled 2022-07-01 (×2): qty 1

## 2022-07-01 MED ORDER — OXYTOCIN-SODIUM CHLORIDE 30-0.9 UT/500ML-% IV SOLN
INTRAVENOUS | Status: AC
  Filled 2022-07-01: qty 1000

## 2022-07-01 MED ORDER — SODIUM CHLORIDE (PF) 0.9 % IJ SOLN
INTRAMUSCULAR | Status: AC
  Filled 2022-07-01: qty 50

## 2022-07-01 MED ORDER — BUPIVACAINE IN DEXTROSE 0.75-8.25 % IT SOLN
INTRATHECAL | Status: DC | PRN
  Administered 2022-07-01: 1.4 mL via INTRATHECAL

## 2022-07-01 MED ORDER — MISOPROSTOL 200 MCG PO TABS
800.0000 ug | ORAL_TABLET | Freq: Once | ORAL | Status: AC
  Administered 2022-07-01: 800 ug via BUCCAL
  Filled 2022-07-01: qty 4

## 2022-07-01 MED ORDER — DIPHENHYDRAMINE HCL 25 MG PO CAPS: 25.0000 mg | ORAL_CAPSULE | Freq: Four times a day (QID) | ORAL | Status: DC | PRN

## 2022-07-01 MED ORDER — EPHEDRINE SULFATE-NACL 50-0.9 MG/10ML-% IV SOSY
PREFILLED_SYRINGE | INTRAVENOUS | Status: DC | PRN
  Administered 2022-07-01: 10 mg via INTRAVENOUS

## 2022-07-01 MED ORDER — SODIUM CHLORIDE 0.9% FLUSH: 3.0000 mL | INTRAVENOUS | Status: DC | PRN

## 2022-07-01 MED ORDER — ONDANSETRON HCL 4 MG/2ML IJ SOLN: 4.0000 mg | Freq: Three times a day (TID) | INTRAMUSCULAR | Status: DC | PRN

## 2022-07-01 MED ORDER — KETOROLAC TROMETHAMINE 30 MG/ML IJ SOLN
INTRAMUSCULAR | Status: DC | PRN
  Administered 2022-07-01: 30 mg via INTRAVENOUS

## 2022-07-01 MED ORDER — MORPHINE SULFATE (PF) 0.5 MG/ML IJ SOLN
INTRAMUSCULAR | Status: AC
  Filled 2022-07-01: qty 10

## 2022-07-01 MED ORDER — METHYLERGONOVINE MALEATE 0.2 MG PO TABS: 0.2000 mg | ORAL_TABLET | ORAL | Status: DC | PRN

## 2022-07-01 MED ORDER — OXYCODONE HCL 5 MG PO TABS
5.0000 mg | ORAL_TABLET | ORAL | Status: DC | PRN
  Administered 2022-07-02 (×3): 5 mg via ORAL
  Administered 2022-07-02 – 2022-07-03 (×2): 10 mg via ORAL
  Administered 2022-07-04: 5 mg via ORAL
  Filled 2022-07-01: qty 2
  Filled 2022-07-01 (×2): qty 1
  Filled 2022-07-01: qty 2

## 2022-07-01 MED ORDER — MORPHINE SULFATE (PF) 0.5 MG/ML IJ SOLN
INTRAMUSCULAR | Status: DC | PRN
  Administered 2022-07-01: 100 ug via INTRATHECAL

## 2022-07-01 SURGICAL SUPPLY — 37 items
APL PRP STRL LF DISP 70% ISPRP (MISCELLANEOUS) ×2
APL SKNCLS STERI-STRIP NONHPOA (GAUZE/BANDAGES/DRESSINGS) ×1
BAG COUNTER SPONGE SURGICOUNT (BAG) ×1 IMPLANT
BAG SPNG CNTER NS LX DISP (BAG) ×1
BENZOIN TINCTURE PRP APPL 2/3 (GAUZE/BANDAGES/DRESSINGS) IMPLANT
BNDG TENSOPLAST 6X5 (GAUZE/BANDAGES/DRESSINGS) IMPLANT
CHLORAPREP W/TINT 26 (MISCELLANEOUS) ×2 IMPLANT
CLOSURE STERI STRIP 1/2 X4 (GAUZE/BANDAGES/DRESSINGS) IMPLANT
DRSG OPSITE POSTOP 4X10 (GAUZE/BANDAGES/DRESSINGS) IMPLANT
DRSG TELFA 3X8 NADH (GAUZE/BANDAGES/DRESSINGS) ×1 IMPLANT
DRSG TELFA 3X8 NADH STRL (GAUZE/BANDAGES/DRESSINGS) ×1 IMPLANT
ELECT REM PT RETURN 9FT ADLT (ELECTROSURGICAL) ×1
ELECTRODE REM PT RTRN 9FT ADLT (ELECTROSURGICAL) ×1 IMPLANT
EXTRT SYSTEM ALEXIS 17CM (MISCELLANEOUS)
GAUZE SPONGE 4X4 12PLY STRL (GAUZE/BANDAGES/DRESSINGS) ×1 IMPLANT
GLOVE BIO SURGEON STRL SZ 6.5 (GLOVE) ×1 IMPLANT
GLOVE SURG UNDER LTX SZ7 (GLOVE) ×1 IMPLANT
GOWN STRL REUS W/ TWL LRG LVL3 (GOWN DISPOSABLE) ×2 IMPLANT
GOWN STRL REUS W/TWL LRG LVL3 (GOWN DISPOSABLE) ×2
KIT TURNOVER KIT A (KITS) ×1 IMPLANT
MANIFOLD NEPTUNE II (INSTRUMENTS) ×1 IMPLANT
MAT PREVALON FULL STRYKER (MISCELLANEOUS) ×1 IMPLANT
NS IRRIG 1000ML POUR BTL (IV SOLUTION) ×1 IMPLANT
PACK C SECTION AR (MISCELLANEOUS) ×1 IMPLANT
PAD DRESSING TELFA 3X8 NADH (GAUZE/BANDAGES/DRESSINGS) IMPLANT
PAD OB MATERNITY 4.3X12.25 (PERSONAL CARE ITEMS) ×1 IMPLANT
PAD PREP 24X41 OB/GYN DISP (PERSONAL CARE ITEMS) ×1 IMPLANT
SCRUB CHG 4% DYNA-HEX 4OZ (MISCELLANEOUS) ×1 IMPLANT
STRIP CLOSURE SKIN 1/2X4 (GAUZE/BANDAGES/DRESSINGS) IMPLANT
SUT MNCRL AB 4-0 PS2 18 (SUTURE) ×1 IMPLANT
SUT PLAIN 2 0 XLH (SUTURE) IMPLANT
SUT VIC AB 0 CT1 36 (SUTURE) ×4 IMPLANT
SUT VIC AB 3-0 SH 27 (SUTURE) ×1
SUT VIC AB 3-0 SH 27X BRD (SUTURE) ×1 IMPLANT
SYSTEM CONTND EXTRCTN KII BLLN (MISCELLANEOUS) IMPLANT
TRAP FLUID SMOKE EVACUATOR (MISCELLANEOUS) ×1 IMPLANT
WATER STERILE IRR 500ML POUR (IV SOLUTION) ×1 IMPLANT

## 2022-07-01 NOTE — Anesthesia Procedure Notes (Signed)
Spinal  Patient location during procedure: OR Start time: 07/01/2022 12:23 PM End time: 07/01/2022 12:27 PM Reason for block: surgical anesthesia Staffing Performed: resident/CRNA  Anesthesiologist: Piscitello, Precious Haws, MD Resident/CRNA: Jerrye Noble, CRNA Performed by: Jerrye Noble, CRNA Authorized by: Andria Frames, MD   Preanesthetic Checklist Completed: patient identified, IV checked, site marked, risks and benefits discussed, surgical consent, monitors and equipment checked, pre-op evaluation and timeout performed Spinal Block Patient position: sitting Prep: ChloraPrep Patient monitoring: heart rate, continuous pulse ox, blood pressure and cardiac monitor Approach: midline Location: L3-4 Injection technique: single-shot Needle Needle type: Introducer and Pencan  Needle gauge: 24 G Needle length: 9 cm Assessment Sensory level: T4 Events: CSF return Additional Notes Sterile aseptic technique used throughout the procedure.  Negative paresthesia. Negative blood return. Positive free-flowing CSF. Expiration date of kit checked and confirmed. Patient tolerated procedure well, without complications.  Attempt x 1

## 2022-07-01 NOTE — Progress Notes (Signed)
Spoke with Valentino Saxon, MD about pt QBL and fundal massage. See new orders. Per MD, administer PRN methergine if pt QBL increases. Per MD, pit to be cut off after 8 hours.

## 2022-07-01 NOTE — Transfer of Care (Signed)
Immediate Anesthesia Transfer of Care Note  Patient: Carmen Morales  Procedure(s) Performed: CESAREAN SECTION  Patient Location: PACU  Anesthesia Type:Spinal  Level of Consciousness: awake, alert  and patient cooperative  Airway & Oxygen Therapy: Patient Spontanous Breathing  Post-op Assessment: Report given to RN and Post -op Vital signs reviewed and stable  Post vital signs: Reviewed and stable  Last Vitals:  Vitals Value Taken Time  BP 99/61 (71)   Temp    Pulse 67 07/01/22 1351  Resp 17 07/01/22 1351  SpO2 99 % 07/01/22 1351  Vitals shown include unvalidated device data.  Last Pain:  Vitals:   07/01/22 1035  TempSrc: Oral  PainSc: 0-No pain         Complications: No notable events documented.

## 2022-07-01 NOTE — Op Note (Signed)
Cesarean Section Procedure Note  Indications: malpresentation: footling breech  Pre-operative Diagnosis: 39 week 2 day pregnancy.  Post-operative Diagnosis: Pregnancy at term with vertex presentation  Surgeon: Hildred Laser, MD  Assistants:  Brennan Bailey, MD  Procedure: Primary low transverse Cesarean Section  Anesthesia: Spinal anesthesia  Findings: Female infant, cephalic presentation, 3560 grams, with Apgar scores of 8 at one minute and 9 at five minutes. Intact placenta with 3 vessel cord.  Clear amniotic fluid at amniotomy The uterine outline, tubes and ovaries appeared normal.   Procedure Details: The patient was seen in the Holding Room. The risks, benefits, complications, treatment options, and expected outcomes were discussed with the patient.  The patient concurred with the proposed plan, giving informed consent.  The site of surgery properly noted/marked. The patient was taken to the Operating Room, identified as Carmen Morales and the procedure verified as C-Section Delivery. A Time Out was held and the above information confirmed.  After induction of anesthesia, the patient was draped and prepped in the usual sterile manner. Anesthesia was tested and noted to be adequate. A Pfannenstiel incision was made and carried down through the subcutaneous tissue to the fascia. Fascial incision was made and extended transversely. The fascia was separated from the underlying rectus tissue superiorly and inferiorly. The peritoneum was identified and entered. Peritoneal incision was extended longitudinally. The surgical assist was able to provide retraction to allow for clear visualization of surgical site. An Alexis retractor was placed in the abdomen for additional retraction. The utero-vesical peritoneal reflection was incised transversely and the bladder flap was bluntly freed from the lower uterine segment. A low transverse uterine incision was made. Delivered from cephalic presentation  was a 3560 gram Female with Apgar scores of 8 at one minute and 9 at five minutes.  The assistant was able to apply adequate fundal pressure to allow for successful delivery of the fetus. After the umbilical cord was clamped and cut, cord blood was obtained for evaluation. Delayed cord clamping was observed. The placenta was removed intact and appeared normal. The uterus was exteriorized and cleared of all clots and debris. The uterine outline, tubes and ovaries appeared normal.  The uterine incision was closed with running locked sutures of 0-Vicryl.  A second suture of 0-Vicryl was used in an imbricating layer.  Hemostasis was observed. The uterus was then returned to the abdomen. The pericolic gutters were cleared of all clots and debris. The fascia was then grasped with Zannie Cove and Tresa Endo clamps, and injected with a total of 50 ml of 1.3% Exparel solution (20 ml of bupivicaine liposomal mixed with 30 ml of 0.5% Marcaine and diluted in 50 ml of normal saline).  The fascia was then reapproximated with a running suture of 0-Vicryl. The subcutaneous fat layer was reapproximated with 2-0 Vicryl. The skin was reapproximated with 4-0 Monocryl. The skin and subcutaneous tissues were then injected with an additional 40 ml of the Exparel solution. The incision was covered with steri-strips and a pressure dressing.   Instrument, sponge, and needle counts were correct prior the abdominal closure and at the conclusion of the case.    An experienced assistant was required given the standard of surgical care given the complexity of the case.  This assistant was needed for exposure, dissection, suctioning, retraction, instrument exchange, and for overall help during the procedure.  Estimated Blood Loss:  485 ml      Drains: foley catheter to gravity drainage, 50 ml of clear urine at end of  the procedure         Total IV Fluids:  900 ml  Specimens: Cord blood         Implants: None         Complications:  None;  patient tolerated the procedure well.         Disposition: PACU - hemodynamically stable.         Condition: stable   Hildred Laser, MD [facility]

## 2022-07-01 NOTE — Anesthesia Preprocedure Evaluation (Addendum)
Anesthesia Evaluation  Patient identified by MRN, date of birth, ID band Patient awake    Reviewed: Allergy & Precautions, NPO status , Patient's Chart, lab work & pertinent test results  History of Anesthesia Complications Negative for: history of anesthetic complications  Airway Mallampati: III  TM Distance: >3 FB Neck ROM: full    Dental  (+) Chipped   Pulmonary neg shortness of breath, asthma ,    Pulmonary exam normal        Cardiovascular Exercise Tolerance: Good hypertension, Normal cardiovascular exam     Neuro/Psych    GI/Hepatic negative GI ROS, neg GERD  ,  Endo/Other    Renal/GU   negative genitourinary   Musculoskeletal   Abdominal   Peds  Hematology negative hematology ROS (+)   Anesthesia Other Findings Past Medical History: No date: Asthma  Past Surgical History: No date: NO PAST SURGERIES 2004: TONSILECTOMY/ADENOIDECTOMY WITH MYRINGOTOMY     Comment:  Tonsilectomy 2014: WISDOM TOOTH EXTRACTION  BMI    Body Mass Index: 35.95 kg/m      Reproductive/Obstetrics (+) Pregnancy                            Anesthesia Physical Anesthesia Plan  ASA: 3  Anesthesia Plan: Spinal   Post-op Pain Management:    Induction:   PONV Risk Score and Plan:   Airway Management Planned: Natural Airway and Nasal Cannula  Additional Equipment:   Intra-op Plan:   Post-operative Plan:   Informed Consent: I have reviewed the patients History and Physical, chart, labs and discussed the procedure including the risks, benefits and alternatives for the proposed anesthesia with the patient or authorized representative who has indicated his/her understanding and acceptance.     Dental Advisory Given  Plan Discussed with: Anesthesiologist, CRNA and Surgeon  Anesthesia Plan Comments: (Patient reports no bleeding problems and no anticoagulant use.  Plan for spinal with backup  GA  Patient consented for risks of anesthesia including but not limited to:  - adverse reactions to medications - damage to eyes, teeth, lips or other oral mucosa - nerve damage due to positioning  - risk of bleeding, infection and or nerve damage from spinal that could lead to paralysis - risk of headache or failed spinal - damage to teeth, lips or other oral mucosa - sore throat or hoarseness - damage to heart, brain, nerves, lungs, other parts of body or loss of life  Patient voiced understanding.)       Anesthesia Quick Evaluation

## 2022-07-01 NOTE — Anesthesia Procedure Notes (Signed)
Date/Time: 07/01/2022 12:30 PM  Performed by: Elmarie Mainland, CRNAPre-anesthesia Checklist: Emergency Drugs available, Suction available, Patient being monitored and Patient identified Patient Re-evaluated:Patient Re-evaluated prior to induction Oxygen Delivery Method: Nasal cannula

## 2022-07-01 NOTE — H&P (Addendum)
Obstetric Preoperative History and Physical  Carmen Morales is a 26 y.o. [phone] with IUP at [redacted]w[redacted]d presenting for presenting for scheduled primary cesarean section for fetal malpresentation (footling breech).  No acute concerns.   Prenatal Course Source of Care: [facility] with onset of care at 18 weeks Pregnancy complications or risks: Patient Active Problem List   Diagnosis Date Noted   Elevated random blood glucose level 04/25/2022   History of macrosomia in infant in prior pregnancy, currently pregnant 02/18/2022   Encounter for care or examination of lactating mother 09/30/2019   Fetal macrosomia during pregnancy 09/08/2019   Fetal abdominal cyst  09/08/2019   Supervision of other normal pregnancy, antepartum 03/22/2019   Asthma during pregnancy 03/22/2019   She plans to breastfeed She desires  OCPs  for postpartum contraception.   Prenatal labs and studies: ABO, Rh: --/--/O POS (09/05 1031) Antibody: NEG (09/05 1031) Rubella: 1.70 (03/31 1140) RPR: Non Reactive (05/30 1046)  HBsAg: Negative (03/31 1140)  HIV: Non Reactive (03/31 1140)  RSW:NIOEVOJJ/-- (08/08 1416) 1 hr Glucola  abnormal, 3 hr GTT normal Genetic screening normal Anatomy US normal   Past Medical History:  Diagnosis Date   Asthma     Past Surgical History:  Procedure Laterality Date   TONSILECTOMY/ADENOIDECTOMY WITH MYRINGOTOMY  2004   Tonsilectomy   WISDOM TOOTH EXTRACTION  2014    OB History  Gravida Para Term Preterm AB Living  5 4 4     4   SAB IAB Ectopic Multiple Live Births        0 4    # Outcome Date GA Lbr Len/2nd Weight Sex Delivery Anes PTL Lv  5 Current           4 Term 09/29/19 [redacted]w[redacted]d 07:23 / 01:48 3830 g F Vag-Spont EPI  LIV  3 Term 11/02/13 [redacted]w[redacted]d  3685 g F Vag-Spont   LIV  2 Term 01/16/11 [redacted]w[redacted]d  4139 g M Vag-Spont   LIV  1 Term 12/03/09 [redacted]w[redacted]d  3232 g F Vag-Spont   LIV    Social History   Socioeconomic History   Marital status: Significant Other    Spouse  name: [redacted]w[redacted]d   Number of children: Not on file   Years of education: Not on file   Highest education level: Not on file  Occupational History   Not on file  Tobacco Use   Smoking status: Never   Smokeless tobacco: Never  Vaping Use   Vaping Use: Never used  Substance and Sexual Activity   Alcohol use: Never   Drug use: Never   Sexual activity: Yes    Birth control/protection: None  Other Topics Concern   Not on file  Social History Narrative   Not on file   Social Determinants of Health   Financial Resource Strain: Low Risk  (09/28/2019)   Overall Financial Resource Strain (CARDIA)    Difficulty of Paying Living Expenses: Not very hard  Food Insecurity: No Food Insecurity (07/01/2022)   Hunger Vital Sign    Worried About Running Out of Food in the Last Year: Never true    Ran Out of Food in the Last Year: Never true  Transportation Needs: No Transportation Needs (07/01/2022)   PRAPARE - 08/31/2022 (Medical): No    Lack of Transportation (Non-Medical): No  Physical Activity: Insufficiently Active (09/28/2019)   Exercise Vital Sign    Days of Exercise per Week: 3 days    Minutes of Exercise  per Session: 30 min  Stress: No Stress Concern Present (09/28/2019)   [employer] of Occupational Health - Occupational Stress Questionnaire    Feeling of Stress : Not at all  Social Connections: Unknown (09/28/2019)   Social Connection and Isolation Panel [NHANES]    Frequency of Communication with Friends and Family: More than three times a week    Frequency of Social Gatherings with Friends and Family: More than three times a week    Attends Religious Services: Patient refused    Database administrator or Organizations: Patient refused    Attends Engineer, structural: Patient refused    Marital Status: Married    Family History  Problem Relation Age of Onset   Breast cancer Mother        83s   Hypertension Neg Hx    Diabetes Neg Hx     Heart attack Neg Hx    Stroke Neg Hx     Medications Prior to Admission  Medication Sig Dispense Refill Last Dose   Prenatal Vit-Fe Fumarate-FA (MULTIVITAMIN-PRENATAL) 27-0.8 MG TABS tablet Take 1 tablet by mouth daily at 12 noon.       No Known Allergies  Review of Systems: Negative except for what is mentioned in HPI.  Physical Exam: BP 113/64 (BP Location: Left Arm)   Pulse 88   Temp 98.8 F (37.1 C) (Oral)   Resp 17   Ht 5\' 4"  (1.626 m)   Wt 94.7 kg   LMP 09/16/2021 (Approximate)   BMI 35.84 kg/m  FHR by Doppler: 140 bpm GENERAL: Well-developed, well-nourished female in no acute distress.  LUNGS: Clear to auscultation bilaterally.  HEART: Regular rate and rhythm. ABDOMEN: Soft, nontender, nondistended, gravid, Leopold's with suspected head to maternal left PELVIC: Deferred EXTREMITIES: Nontender, no edema, 2+ distal pulses.   Pertinent Labs/Studies:   Results for orders placed or performed during the hospital encounter of 07/01/22 (from the past 72 hour(s))  Type and screen     Status: None   Collection Time: 07/01/22 10:31 AM  Result Value Ref Range   ABO/RH(D) O POS    Antibody Screen NEG    Sample Expiration      07/04/2022,2359 Performed at [facility], [address] [place], [place]   CBC     Status: None   Collection Time: 07/01/22 10:31 AM  Result Value Ref Range   WBC 7.6 4.0 - 10.5 K/uL   RBC 4.48 3.87 - 5.11 MIL/uL   Hemoglobin 12.1 12.0 - 15.0 g/dL   HCT 08/31/22 29.5 - 18.8 %   MCV 83.3 80.0 - 100.0 fL   MCH 27.0 26.0 - 34.0 pg   MCHC 32.4 30.0 - 36.0 g/dL   RDW 41.6 60.6 - 30.1 %   Platelets 151 150 - 400 K/uL   nRBC 0.0 0.0 - 0.2 %    Comment: Performed at [facility], [address], [place], [place]    Assessment and Plan :Carmen Morales is a 25 y.o. 26 at [redacted]w[redacted]d being admitted  for scheduled cesarean section delivery . The patient is understanding of the planned procedure and is aware of and  accepting of all surgical risks, including but not limited to: bleeding which may require transfusion or reoperation; infection which may require antibiotics; injury to bowel, bladder, ureters or other surrounding organs which may require repair; injury to the fetus; need for additional procedures including hysterectomy in the event of life-threatening complications; placental abnormalities wth  subsequent pregnancies; incisional problems; blood clot disorders which may require blood thinners;, and other postoperative/anesthesia complications. The patient is in agreement with the proposed plan, and gives informed written consent for the procedure. All questions have been answered.    Hildred Laser, MD [facility]

## 2022-07-02 ENCOUNTER — Encounter: Payer: Self-pay | Admitting: Obstetrics and Gynecology

## 2022-07-02 DIAGNOSIS — Z8759 Personal history of other complications of pregnancy, childbirth and the puerperium: Secondary | ICD-10-CM

## 2022-07-02 LAB — CBC
HCT: 21.7 % — ABNORMAL LOW (ref 36.0–46.0)
Hemoglobin: 7.2 g/dL — ABNORMAL LOW (ref 12.0–15.0)
MCH: 27.9 pg (ref 26.0–34.0)
MCHC: 33.2 g/dL (ref 30.0–36.0)
MCV: 84.1 fL (ref 80.0–100.0)
Platelets: 147 10*3/uL — ABNORMAL LOW (ref 150–400)
RBC: 2.58 MIL/uL — ABNORMAL LOW (ref 3.87–5.11)
RDW: 15 % (ref 11.5–15.5)
WBC: 10.1 10*3/uL (ref 4.0–10.5)
nRBC: 0 % (ref 0.0–0.2)

## 2022-07-02 LAB — RPR: RPR Ser Ql: NONREACTIVE

## 2022-07-02 MED ORDER — LACTATED RINGERS IV BOLUS: 250.0000 mL | Freq: Once | INTRAVENOUS | Status: DC

## 2022-07-02 NOTE — Progress Notes (Signed)
Postpartum Day # 1: Cesarean Delivery (primary), delayed PPH  Subjective: Patient reports that her bleeding has slowed down.  Feels warm currently but otherwise ok.  Denies SOB, lightheadedness, chest pain.   Objective: Vital signs in last 24 hours: Temp:  [97 F (36.1 C)-100.3 F (37.9 C)] 98.7 F (37.1 C) (09/06 0305) Pulse Rate:  [48-96] 78 (09/06 0600) Resp:  [9-29] 20 (09/06 0305) BP: (84-113)/(51-71) 98/55 (09/06 0305) SpO2:  [97 %-100 %] 98 % (09/06 0600) Weight:  [94.7 kg] 94.7 kg (09/05 1035)  Physical Exam:  General: alert and no distress Lungs: clear to auscultation bilaterally Breasts: normal appearance, no masses or tenderness Heart: regular rate and rhythm, S1, S2 normal, no murmur, click, rub or gallop Abdomen: soft, non-tender; bowel sounds normal; no masses,  no organomegaly Pelvis: Lochia appropriate, Uterine Fundus firm, Incision: bandage with lower 1/3 lightly soaked with old brown blood.  Extremities: DVT Evaluation: No evidence of DVT seen on physical exam. Negative Homan's sign. No cords or calf tenderness. No significant calf/ankle edema.  Labs:     Latest Ref Rng & Units 07/02/2022    6:16 AM 07/01/2022    5:23 PM 07/01/2022   10:31 AM  CBC  WBC 4.0 - 10.5 K/uL 10.1   7.6   Hemoglobin 12.0 - 15.0 g/dL 7.2  9.8  96.2   Hematocrit 36.0 - 46.0 % 21.7  30.4  37.3   Platelets 150 - 400 K/uL 147   151      Assessment/Plan: Status post Cesarean section. Postoperative course complicated by delayed postpartum hemorrhage. Received TXA and Cytotec overnight. Discussed possibility of requiring IV iron infusion vs blood transfusion if she becomes symptomatic.    Breastfeeding and formula feeding, Lactation consult as needed Contraception OCPs Regular diet Continue PO pain management Remove foley catheter Discontinue IVF Continue current care.  Can discharge home tomorrow if patient remains stable.   Hildred Laser, MD [facility]

## 2022-07-02 NOTE — Anesthesia Post-op Follow-up Note (Signed)
  Anesthesia Pain Follow-up Note  Patient: Carmen Morales  Day #: 1  Date of Follow-up: 07/02/2022 Time: 8:55 AM  Last Vitals:  Vitals:   07/02/22 0800 07/02/22 0822  BP:  (!) 95/53  Pulse: 78 81  Resp:    Temp:  37 C  SpO2: 98% 100%    Level of Consciousness: alert  Pain: none   Side Effects:None  Catheter Site Exam:clean, dry     Plan: D/C from anesthesia care at surgeon's request  Aurther Harlin

## 2022-07-02 NOTE — Anesthesia Postprocedure Evaluation (Signed)
Anesthesia Post Note  Patient: Carmen Morales  Procedure(s) Performed: CESAREAN SECTION  Patient location during evaluation: Nursing Unit Anesthesia Type: Spinal Level of consciousness: oriented and awake and alert Pain management: pain level controlled Vital Signs Assessment: post-procedure vital signs reviewed and stable Respiratory status: spontaneous breathing and respiratory function stable Cardiovascular status: blood pressure returned to baseline and stable Postop Assessment: no headache, no backache, no apparent nausea or vomiting and patient able to bend at knees Anesthetic complications: no   No notable events documented.   Last Vitals:  Vitals:   07/02/22 0800 07/02/22 0822  BP:  (!) 95/53  Pulse: 78 81  Resp:    Temp:  37 C  SpO2: 98% 100%    Last Pain:  Vitals:   07/02/22 0822  TempSrc: Oral  PainSc:                  Jeanine Luz

## 2022-07-03 LAB — PREPARE RBC (CROSSMATCH)

## 2022-07-03 LAB — CBC
HCT: 20.4 % — ABNORMAL LOW (ref 36.0–46.0)
Hemoglobin: 6.6 g/dL — ABNORMAL LOW (ref 12.0–15.0)
MCH: 27.7 pg (ref 26.0–34.0)
MCHC: 32.4 g/dL (ref 30.0–36.0)
MCV: 85.7 fL (ref 80.0–100.0)
Platelets: 119 10*3/uL — ABNORMAL LOW (ref 150–400)
RBC: 2.38 MIL/uL — ABNORMAL LOW (ref 3.87–5.11)
RDW: 15.8 % — ABNORMAL HIGH (ref 11.5–15.5)
WBC: 8.8 10*3/uL (ref 4.0–10.5)
nRBC: 0 % (ref 0.0–0.2)

## 2022-07-03 LAB — HEMOGLOBIN AND HEMATOCRIT, BLOOD
HCT: 26.6 % — ABNORMAL LOW (ref 36.0–46.0)
Hemoglobin: 8.7 g/dL — ABNORMAL LOW (ref 12.0–15.0)

## 2022-07-03 MED ORDER — SIMETHICONE 80 MG PO CHEW
160.0000 mg | CHEWABLE_TABLET | Freq: Four times a day (QID) | ORAL | Status: DC | PRN
  Administered 2022-07-03 (×2): 160 mg via ORAL
  Filled 2022-07-03 (×2): qty 2

## 2022-07-03 MED ORDER — LACTATED RINGERS IV BOLUS
500.0000 mL | Freq: Once | INTRAVENOUS | Status: AC
  Administered 2022-07-03: 500 mL via INTRAVENOUS

## 2022-07-03 MED ORDER — SODIUM CHLORIDE 0.9% IV SOLUTION: Freq: Once | INTRAVENOUS | Status: AC

## 2022-07-03 NOTE — Progress Notes (Signed)
Subjective: Postpartum Day 2: Cesarean Delivery Carmen Morales is feeling very tired. Receiving blood now for hgb of 6.6. She is voiding without difficulty and tolerating POs. Bleeding is WNL.  Objective: Vital signs in last 24 hours: Temp:  [97.8 F (36.6 C)-98.7 F (37.1 C)] 98.3 F (36.8 C) (09/07 0831) Pulse Rate:  [64-87] 79 (09/07 0831) Resp:  [17-20] 20 (09/07 0745) BP: (83-102)/(49-62) 92/58 (09/07 0831) SpO2:  [94 %-99 %] 99 % (09/07 0831)  Physical Exam:  General: alert, cooperative, appears stated age, and fatigued Heart: RRR, murmur noted Lungs: CTAB Abdomen: normal bowel sounds Lochia: appropriate Uterine Fundus: firm Incision: healing well, no significant drainage, well-approximated DVT Evaluation: No evidence of DVT seen on physical exam. SCDs on  Recent Labs    07/02/22 0616 07/03/22 0457  HGB 7.2* 6.6*  HCT 21.7* 20.4*    Assessment/Plan: Status post Cesarean section. Postoperative course complicated by symptomatic blood loss anemia. Receiving blood now.   Continue current care. Lactation assistance PRN Encouraged rest, hydration, and ambulation when stable. Plan discharge tomorrow.  Glenetta Borg, CNM 07/03/2022, 10:47 AM

## 2022-07-04 LAB — CBC
HCT: 25.5 % — ABNORMAL LOW (ref 36.0–46.0)
Hemoglobin: 8.3 g/dL — ABNORMAL LOW (ref 12.0–15.0)
MCH: 28 pg (ref 26.0–34.0)
MCHC: 32.5 g/dL (ref 30.0–36.0)
MCV: 86.1 fL (ref 80.0–100.0)
Platelets: 138 10*3/uL — ABNORMAL LOW (ref 150–400)
RBC: 2.96 MIL/uL — ABNORMAL LOW (ref 3.87–5.11)
RDW: 15.5 % (ref 11.5–15.5)
WBC: 9 10*3/uL (ref 4.0–10.5)
nRBC: 0 % (ref 0.0–0.2)

## 2022-07-04 LAB — TYPE AND SCREEN
ABO/RH(D): O POS
Antibody Screen: NEGATIVE
Unit division: 0
Unit division: 0

## 2022-07-04 LAB — BPAM RBC
Blood Product Expiration Date: 202309102359
Blood Product Expiration Date: 202310142359
ISSUE DATE / TIME: 202309070802
ISSUE DATE / TIME: 202309071207
Unit Type and Rh: 5100
Unit Type and Rh: 5100

## 2022-07-04 MED ORDER — OXYCODONE HCL 5 MG PO TABS: 5.0000 mg | ORAL_TABLET | Freq: Four times a day (QID) | ORAL | 0 refills | Status: AC | PRN

## 2022-07-04 MED ORDER — FERROUS SULFATE 325 (65 FE) MG PO TABS: 325.0000 mg | ORAL_TABLET | Freq: Two times a day (BID) | ORAL | 1 refills | Status: AC

## 2022-07-04 NOTE — Progress Notes (Signed)
Mother discharged. Discharge instructions given. Mother verbalizes understanding. Transported by axillary.  

## 2022-07-04 NOTE — Discharge Summary (Signed)
Obstetrical Discharge Summary  Date of Admission: 07/01/2022 Date of Discharge: 07/04/2022  Primary OB: [facility]  Gestational Age at Delivery: [redacted]w[redacted]d   Antepartum complications: transverse/footling breech Reason for Admission: primary c-section  Date of Delivery: 07/01/2022  Delivered By: Dr Valentino Saxon  Delivery Type: primary cesarean section, low transverse incision Intrapartum complications/course: None Anesthesia: spinal Placenta: Delivered and expressed via active management. Intact: yes. To pathology: no.  Laceration: n/a Episiotomy: LCT uterus EBL: Baby: Liveborn female, APGARs 8/9, weight 3560 g.    Discharge Diagnosis: Delivered.   Postpartum course: Received blood transfusion for Hemoglobin of 6.6. Doing well.  Ambulating and voiding without difficulty. Has had a BM and passing flatus. Pain well managed with PRN medications, desires script for oxy "just in case". Mood is good.  Breastfeeding independently, also giving formula as she is not sure if the infant is "getting enough". Has nursed other children for up tot 2 years. Has good support at home.  Is ready to be discharged.  Discharge Vital Signs:  Current Vital Signs 24h Vital Sign Ranges  T 98.5 F (36.9 C) Temp  Avg: 98.2 F (36.8 C)  Min: 97.9 F (36.6 C)  Max: 98.7 F (37.1 C)  BP 96/68 BP  Min: 88/54  Max: 108/67  HR 95 Pulse  Avg: 84.2  Min: 74  Max: 95  RR 18 Resp  Avg: 18.8  Min: 18  Max: 20  SaO2 100 % Room Air SpO2  Avg: 98.1 %  Min: 94 %  Max: 100 %       24 Hour I/O Current Shift I/O  Time Ins Outs 09/07 0701 - 09/08 0700 In: 800.3 [I.V.:10] Out: 900 [Urine:900] No intake/output data recorded.      Discharge Exam:  NAD Breasts: soft, no redness, nipples erect and intact bilaterally.  Abdomen: firm fundus below the umbilicus, NTTP, non distended, Incision c/d/i with honey comb  Lungs: breathing without difficulty.  Ext: trace BLE, negative homan's   Recent Labs  Lab 07/02/22 0616  07/03/22 0457 07/03/22 1649 07/04/22 0409  WBC 10.1 8.8  --  9.0  HGB 7.2* 6.6* 8.7* 8.3*  HCT 21.7* 20.4* 26.6* 25.5*  PLT 147* 119*  --  138*    Disposition: Home  Rh Immune globulin given: no Rubella vaccine given: no Tdap vaccine given in AP or PP setting: yes Flu vaccine given in AP or PP setting: no  Contraception: Depo-Provera  Prenatal/Postnatal Panel: O POS//Rubella Immune//Varicella Immune//RPR negative//HIV negative/HepB Surface Ag negative//pap no abnormalities (date: 2020)//plans to breastfeed  Plan:  Carmen Morales was discharged to home in good condition. Follow-up appointment with Dr Valentino Saxon in 1 week for a incision check then in 6 weeks for PP visit.    Future Appointments  Date Time Provider Department Center  07/10/2022 10:45 AM Hildred Laser, MD [facility] None    Discharge Medications: Allergies as of 07/04/2022   No Known Allergies      Medication List     TAKE these medications    ferrous sulfate 325 (65 FE) MG tablet Commonly known as: FerrouSul Take 1 tablet (325 mg total) by mouth 2 (two) times daily.   multivitamin-prenatal 27-0.8 MG Tabs tablet Take 1 tablet by mouth daily at 12 noon.   oxyCODONE 5 MG immediate release tablet Commonly known as: Oxy IR/ROXICODONE Take 1-2 tablets (5-10 mg total) by mouth every 6 (six) hours as needed for severe pain.  Discharge Care Instructions  (From admission, onward)           Start     Ordered   07/04/22 0000  Discharge wound care:       Comments: SHOWER DAILY Wash incision gently with soap and water.  Call office with any drainage, redness, or firmness of the incision.   07/04/22 5797            Carie Caddy, CNM  [facility], [facility]  @TODAY @  9:24 AM

## 2022-07-07 ENCOUNTER — Emergency Department
Admission: EM | Admit: 2022-07-07 | Discharge: 2022-07-07 | Disposition: A | Discharge status: Home / Self Care | Payer: Medicaid Other | Attending: Emergency Medicine | Admitting: Emergency Medicine

## 2022-07-07 ENCOUNTER — Encounter: Payer: Self-pay | Admitting: Emergency Medicine

## 2022-07-07 ENCOUNTER — Other Ambulatory Visit: Payer: Self-pay

## 2022-07-07 DIAGNOSIS — N390 Urinary tract infection, site not specified: Secondary | ICD-10-CM | POA: Insufficient documentation

## 2022-07-07 DIAGNOSIS — R6883 Chills (without fever): Secondary | ICD-10-CM | POA: Diagnosis present

## 2022-07-07 LAB — CBC WITH DIFFERENTIAL/PLATELET
Abs Immature Granulocytes: 0.04 10*3/uL (ref 0.00–0.07)
Basophils Absolute: 0 10*3/uL (ref 0.0–0.1)
Basophils Relative: 0 %
Eosinophils Absolute: 0.1 10*3/uL (ref 0.0–0.5)
Eosinophils Relative: 2 %
HCT: 28 % — ABNORMAL LOW (ref 36.0–46.0)
Hemoglobin: 8.9 g/dL — ABNORMAL LOW (ref 12.0–15.0)
Immature Granulocytes: 1 %
Lymphocytes Relative: 30 %
Lymphs Abs: 1.7 10*3/uL (ref 0.7–4.0)
MCH: 27.7 pg (ref 26.0–34.0)
MCHC: 31.8 g/dL (ref 30.0–36.0)
MCV: 87.2 fL (ref 80.0–100.0)
Monocytes Absolute: 0.5 10*3/uL (ref 0.1–1.0)
Monocytes Relative: 8 %
Neutro Abs: 3.3 10*3/uL (ref 1.7–7.7)
Neutrophils Relative %: 59 %
Platelets: 153 10*3/uL (ref 150–400)
RBC: 3.21 MIL/uL — ABNORMAL LOW (ref 3.87–5.11)
RDW: 14.9 % (ref 11.5–15.5)
WBC: 5.6 10*3/uL (ref 4.0–10.5)
nRBC: 0 % (ref 0.0–0.2)

## 2022-07-07 LAB — URINALYSIS, ROUTINE W REFLEX MICROSCOPIC
Bacteria, UA: NONE SEEN
Bilirubin Urine: NEGATIVE
Glucose, UA: NEGATIVE mg/dL
Ketones, ur: NEGATIVE mg/dL
Nitrite: NEGATIVE
Protein, ur: NEGATIVE mg/dL
Specific Gravity, Urine: 1.005 (ref 1.005–1.030)
pH: 7 (ref 5.0–8.0)

## 2022-07-07 LAB — BASIC METABOLIC PANEL
Anion gap: 7 (ref 5–15)
BUN: 10 mg/dL (ref 6–20)
CO2: 24 mmol/L (ref 22–32)
Calcium: 8.4 mg/dL — ABNORMAL LOW (ref 8.9–10.3)
Chloride: 111 mmol/L (ref 98–111)
Creatinine, Ser: 0.69 mg/dL (ref 0.44–1.00)
GFR, Estimated: >60 mL/min (ref >60)
Glucose, Bld: 100 mg/dL — ABNORMAL HIGH (ref 70–99)
Potassium: 3.5 mmol/L (ref 3.5–5.1)
Sodium: 142 mmol/L (ref 135–145)

## 2022-07-07 MED ORDER — CEFDINIR 300 MG PO CAPS: 300.0000 mg | ORAL_CAPSULE | Freq: Two times a day (BID) | ORAL | 0 refills | Status: AC

## 2022-07-07 NOTE — Discharge Instructions (Signed)
Keep your appointment with your OB/GYN on Thursday. Begin taking antibiotics as directed twice a day until completely finished. Increase fluids to stay hydrated.  You may take Tylenol sparingly if needed for pain.

## 2022-07-07 NOTE — ED Triage Notes (Signed)
Pt arrives with c/o incisional pain after c-section. Pt denies n/v or fever. Per pt, she feels like incision is opening up.

## 2022-07-07 NOTE — Progress Notes (Deleted)
    OBSTETRICS/GYNECOLOGY POST-OPERATIVE CLINIC VISIT  Subjective:     Carmen Morales is a 26 y.o. female who presents to the clinic 1 weeks status post CESAREAN SECTION for Fetal malpresentation- primary cesarean section. Eating a regular diet {with-without:5700} difficulty. Bowel movements are {normal/abnormal***:19619}. {pain control:13522::"The patient is not having any pain."}  {Common ambulatory SmartLinks:19316}  Review of Systems {ros; complete:30496}   Objective:   There were no vitals taken for this visit. There is no height or weight on file to calculate BMI.  General:  alert and no distress  Abdomen: soft, bowel sounds active, non-tender  Incision:   {incision:13716::"no dehiscence","incision well approximated","healing well","no drainage","no erythema","no hernia","no seroma","no swelling"}    Pathology:    Assessment:   Patient s/p *** (surgery)  {doing well:13525::"Doing well postoperatively."}   Plan:   1. Continue any current medications as instructed by provider. 2. Wound care discussed. 3. Operative findings again reviewed. Pathology report discussed. 4. Activity restrictions: {restrictions:13723} 5. Anticipated return to work: {work return:14002}. 6. Follow up: {1-16:57903} {time; units:18646} for ***    Hildred Laser, MD [facility]

## 2022-07-07 NOTE — ED Provider Notes (Signed)
[facility] Provider Note    Event Date/Time   First MD Initiated Contact with Patient 07/07/22 1159     (approximate)   History   Post-op Problem   HPI  Carmen Morales is a 26 y.o. female   presents to the ED with concerns at her cesarean site incision.  Patient states there is pain at the site.  She denies any fever but states she has some chills last evening.  She denies any nausea, vomiting or diarrhea.  Patient's cesarean section was 07/01/2022 and she has follow-up appoint with Dr. Valentino Saxon on Thursday.      Physical Exam   Triage Vital Signs: ED Triage Vitals  Enc Vitals Group     BP 07/07/22 1146 (!) 127/98     Pulse Rate 07/07/22 1146 81     Resp 07/07/22 1146 16     Temp 07/07/22 1146 97.8 F (36.6 C)     Temp Source 07/07/22 1146 Oral     SpO2 07/07/22 1146 97 %     Weight 07/07/22 1151 208 lb 12.4 oz (94.7 kg)     Height 07/07/22 1151 5\' 4"  (1.626 m)     Head Circumference --      Peak Flow --      Pain Score 07/07/22 1147 8     Pain Loc --      Pain Edu? --      Excl. in GC? --     Most recent vital signs: Vitals:   07/07/22 1146 07/07/22 1330  BP: (!) 127/98 120/88  Pulse: 81 78  Resp: 16 16  Temp: 97.8 F (36.6 C)   SpO2: 97% 97%     General: Awake, no distress.  CV:  Good peripheral perfusion.  Resp:  Normal effort.  Abd:  No distention.  Abdomen soft with tenderness noted.  Tenderness is noted peri-incisional site area.  No erythema or drainage is noted. Other:     ED Results / Procedures / Treatments   Labs (all labs ordered are listed, but only abnormal results are displayed) Labs Reviewed  CBC WITH DIFFERENTIAL/PLATELET - Abnormal; Notable for the following components:      Result Value   RBC 3.21 (*)    Hemoglobin 8.9 (*)    HCT 28.0 (*)    All other components within normal limits  BASIC METABOLIC PANEL - Abnormal; Notable for the following components:   Glucose, Bld 100 (*)    Calcium 8.4 (*)     All other components within normal limits  URINALYSIS, ROUTINE W REFLEX MICROSCOPIC - Abnormal; Notable for the following components:   Color, Urine YELLOW (*)    APPearance HAZY (*)    Hgb urine dipstick LARGE (*)    Leukocytes,Ua MODERATE (*)    All other components within normal limits      PROCEDURES:  Critical Care performed:   Procedures   MEDICATIONS ORDERED IN ED: Medications - No data to display   IMPRESSION / MDM / ASSESSMENT AND PLAN / ED COURSE  I reviewed the triage vital signs and the nursing notes.   Differential diagnosis includes, but is not limited to, infected abdominal incision from recent abdominal surgery, abdominal pain, urinary tract infection, post surgical infection.  26 year old female presents to the ED with concerns of possible infection at the site of her cesarean incision.  Patient denies any fever or chills.  She has not seen any drainage from the area.  Lab work included  a BMP and CBC which were reassuring with the exception of her hemoglobin and hematocrit which was low at 8.9 and 28.0 respectively.  Urinalysis showed moderate leukocytes with 21-50 RBCs and 21-50 WBCs.  Patient was made aware.  A prescription for cefdinir 300 mg twice daily was sent to the pharmacy is encouraged to increase fluids to stay hydrated.  She will keep her appointment with her OB/GYN this week.      Patient's presentation is most consistent with acute complicated illness / injury requiring diagnostic workup.  FINAL CLINICAL IMPRESSION(S) / ED DIAGNOSES   Final diagnoses:  Acute UTI     Rx / DC Orders   ED Discharge Orders          Ordered    cefdinir (OMNICEF) 300 MG capsule  2 times daily        07/07/22 1322             Note:  This document was prepared using Dragon voice recognition software and may include unintentional dictation errors.   Tommi Rumps, PA-C 07/07/22 1423    Chesley Noon, MD 07/07/22 1517

## 2022-07-07 NOTE — ED Notes (Signed)
See triage note  Presents with pain at incision site  S/P c-section 6 days ago  Honeycomb dressing is intact and dry  States she is having some pain to right side of abd   Afebrile   Denies any urinary sxs'   HAs f/u appt on Thursday

## 2022-07-08 ENCOUNTER — Encounter: Payer: Medicaid Other | Admitting: Obstetrics and Gynecology

## 2022-07-09 NOTE — Progress Notes (Addendum)
    OBSTETRICS/GYNECOLOGY POST-OPERATIVE CLINIC VISIT  Subjective:     Carmen Morales is a 26 y.o. [id number] female who presents to the clinic 2 weeks status post  primary low-transverse C-section  for  breech presentation at [redacted] weeks gestation . Eating a regular diet without difficulty. Bowel movements are normal. Pain is controlled with current analgesics. Medications being used: acetaminophen and prescription NSAID's including Motrin.  Pain mostly on right side.   Patient also presents for a 2 week postpartum mood check.  Postpartum course has been well so far. Baby is feeding by breast, going well. Bleeding: light to moderate. Postpartum depression screening: negative.  EDPS score is 0.      The following portions of the patient's history were reviewed and updated as appropriate: allergies, current medications, past family history, past medical history, past social history, past surgical history, and problem list.   The following portions of the patient's history were reviewed and updated as appropriate: allergies, current medications, past family history, past medical history, past social history, past surgical history, and problem list.  Review of Systems Pertinent items noted in HPI and remainder of comprehensive ROS otherwise negative.   Objective:   There were no vitals taken for this visit. There is no height or weight on file to calculate BMI.  General:  alert and no distress  Abdomen: soft, bowel sounds active, non-tender  Incision:   healing well, no drainage, no erythema, no hernia, no seroma, no swelling, no dehiscence, incision well approximated     Assessment:   Patient s/p primary C-section (surgery)  Doing well postoperatively.  Mood screen postpartum.   Plan:   1. Continue any current medications as instructed by provider. Also encouraged use of OTC Lidocaine patches for right sided pain.  2. Wound care discussed. 3. Operative findings again reviewed.  Pathology report discussed. 4. Activity restrictions: no bending, stooping, or squatting, no lifting more than 10-15 pounds, and pelvic rest.  5. Anticipated return to work: 4 weeks. 6. Follow up: 4 weeks for postpartum visit    Hildred Laser, MD [facility]

## 2022-07-10 ENCOUNTER — Encounter: Payer: Self-pay | Admitting: Obstetrics and Gynecology

## 2022-07-10 ENCOUNTER — Ambulatory Visit (INDEPENDENT_AMBULATORY_CARE_PROVIDER_SITE_OTHER): Payer: Medicaid Other | Admitting: Obstetrics and Gynecology

## 2022-07-10 VITALS — BP 101/68 | HR 54 | Resp 16 | Ht 64.0 in | Wt 196.9 lb

## 2022-07-10 DIAGNOSIS — Z98891 History of uterine scar from previous surgery: Secondary | ICD-10-CM

## 2022-07-10 DIAGNOSIS — Z4889 Encounter for other specified surgical aftercare: Secondary | ICD-10-CM

## 2022-07-10 DIAGNOSIS — Z1332 Encounter for screening for maternal depression: Secondary | ICD-10-CM
